# Patient Record
Sex: Male | Born: 1990 | Race: Black or African American | Hispanic: No | Marital: Single | Smoking: Former smoker
Health system: Southern US, Community
[De-identification: ages and names within clinical notes are randomized; demographics above are authoritative.]

## PROBLEM LIST (undated history)

## (undated) DIAGNOSIS — Z789 Other specified health status: Secondary | ICD-10-CM

---
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
## 2011-02-09 LAB — DIFFERENTIAL
Basophils Absolute: 0 10*3/uL (ref 0.0–0.1)
Eosinophils Relative: 0 % (ref 0–5)
Lymphocytes Relative: 17 % (ref 12–46)
Monocytes Absolute: 0.6 10*3/uL (ref 0.1–1.0)
Monocytes Relative: 7 % (ref 3–12)
Neutrophils Relative %: 75 % (ref 43–77)

## 2011-02-09 LAB — CBC
MCHC: 33.8 g/dL (ref 30.0–36.0)
Platelets: 155 10*3/uL (ref 150–400)
RDW: 12.8 % (ref 11.5–15.5)

## 2011-02-09 LAB — BASIC METABOLIC PANEL
BUN: 9 mg/dL (ref 6–23)
CO2: 24 mEq/L (ref 19–32)
Sodium: 141 mEq/L (ref 135–145)

## 2018-02-08 ENCOUNTER — Inpatient Hospital Stay (HOSPITAL_COMMUNITY)
Admission: EM | Admit: 2018-02-08 | Discharge: 2018-02-17 | DRG: 907 | Disposition: A | Discharge status: Home Health | Payer: Self-pay | Attending: General Surgery | Admitting: General Surgery

## 2018-02-08 ENCOUNTER — Encounter (HOSPITAL_COMMUNITY): Admission: EM | Disposition: A | Discharge status: Home Health | Payer: Self-pay | Source: Home / Self Care

## 2018-02-08 ENCOUNTER — Emergency Department (HOSPITAL_COMMUNITY): Payer: Self-pay | Admitting: Certified Registered Nurse Anesthetist

## 2018-02-08 ENCOUNTER — Encounter (HOSPITAL_COMMUNITY): Payer: Self-pay | Admitting: Radiology

## 2018-02-08 ENCOUNTER — Emergency Department (HOSPITAL_COMMUNITY): Payer: Self-pay

## 2018-02-08 ENCOUNTER — Inpatient Hospital Stay (HOSPITAL_COMMUNITY): Payer: Self-pay

## 2018-02-08 DIAGNOSIS — S75012A Minor laceration of femoral artery, left leg, initial encounter: Secondary | ICD-10-CM | POA: Diagnosis present

## 2018-02-08 DIAGNOSIS — S75122A Major laceration of femoral vein at hip and thigh level, left leg, initial encounter: Principal | ICD-10-CM | POA: Diagnosis present

## 2018-02-08 DIAGNOSIS — S72351B Displaced comminuted fracture of shaft of right femur, initial encounter for open fracture type I or II: Secondary | ICD-10-CM

## 2018-02-08 DIAGNOSIS — W3400XA Accidental discharge from unspecified firearms or gun, initial encounter: Secondary | ICD-10-CM

## 2018-02-08 DIAGNOSIS — R571 Hypovolemic shock: Secondary | ICD-10-CM | POA: Diagnosis present

## 2018-02-08 DIAGNOSIS — I743 Embolism and thrombosis of arteries of the lower extremities: Secondary | ICD-10-CM | POA: Diagnosis present

## 2018-02-08 DIAGNOSIS — R000 Tachycardia, unspecified: Secondary | ICD-10-CM | POA: Diagnosis present

## 2018-02-08 DIAGNOSIS — D62 Acute posthemorrhagic anemia: Secondary | ICD-10-CM | POA: Diagnosis present

## 2018-02-08 DIAGNOSIS — S31823A Puncture wound without foreign body of left buttock, initial encounter: Secondary | ICD-10-CM | POA: Diagnosis present

## 2018-02-08 DIAGNOSIS — Y92410 Unspecified street and highway as the place of occurrence of the external cause: Secondary | ICD-10-CM

## 2018-02-08 DIAGNOSIS — S72142A Displaced intertrochanteric fracture of left femur, initial encounter for closed fracture: Secondary | ICD-10-CM | POA: Diagnosis present

## 2018-02-08 DIAGNOSIS — Z419 Encounter for procedure for purposes other than remedying health state, unspecified: Secondary | ICD-10-CM

## 2018-02-08 DIAGNOSIS — S72102A Unspecified trochanteric fracture of left femur, initial encounter for closed fracture: Secondary | ICD-10-CM

## 2018-02-08 DIAGNOSIS — S3133XA Puncture wound without foreign body of scrotum and testes, initial encounter: Secondary | ICD-10-CM | POA: Diagnosis present

## 2018-02-08 HISTORY — PX: INSERTION OF TRACTION PIN: SHX6560

## 2018-02-08 HISTORY — DX: Other specified health status: Z78.9

## 2018-02-08 HISTORY — PX: FEMORAL ARTERY EXPLORATION: SHX5160

## 2018-02-08 HISTORY — PX: SCROTAL EXPLORATION: SHX2386

## 2018-02-08 LAB — POCT I-STAT 7, (LYTES, BLD GAS, ICA,H+H)
ACID-BASE EXCESS: 2 mmol/L (ref 0.0–2.0)
Acid-Base Excess: 2 mmol/L (ref 0.0–2.0)
Acid-Base Excess: 1 mmol/L (ref 0.0–2.0)
Acid-Base Excess: 1 mmol/L (ref 0.0–2.0)
BICARBONATE: 27.2 mmol/L (ref 20.0–28.0)
BICARBONATE: 25 mmol/L (ref 20.0–28.0)
Bicarbonate: 27.1 mmol/L (ref 20.0–28.0)
Bicarbonate: 27 mmol/L (ref 20.0–28.0)
Bicarbonate: 25.5 mmol/L (ref 20.0–28.0)
CALCIUM ION: 0.97 mmol/L — AB (ref 1.15–1.40)
CALCIUM ION: 1.07 mmol/L — AB (ref 1.15–1.40)
CALCIUM ION: 1.1 mmol/L — AB (ref 1.15–1.40)
Calcium, Ion: 0.99 mmol/L — ABNORMAL LOW (ref 1.15–1.40)
Calcium, Ion: 0.99 mmol/L — ABNORMAL LOW (ref 1.15–1.40)
HCT: 27 % — ABNORMAL LOW (ref 39.0–52.0)
HCT: 27 % — ABNORMAL LOW (ref 39.0–52.0)
HCT: 27 % — ABNORMAL LOW (ref 39.0–52.0)
HEMATOCRIT: 25 % — AB (ref 39.0–52.0)
HEMATOCRIT: 25 % — AB (ref 39.0–52.0)
Hemoglobin: 9.2 g/dL — ABNORMAL LOW (ref 13.0–17.0)
Hemoglobin: 8.5 g/dL — ABNORMAL LOW (ref 13.0–17.0)
Hemoglobin: 8.5 g/dL — ABNORMAL LOW (ref 13.0–17.0)
Hemoglobin: 9.2 g/dL — ABNORMAL LOW (ref 13.0–17.0)
Hemoglobin: 9.2 g/dL — ABNORMAL LOW (ref 13.0–17.0)
O2 SAT: 100 %
O2 Saturation: 100 %
O2 Saturation: 100 %
O2 Saturation: 100 %
O2 Saturation: 100 %
PH ART: 7.391 (ref 7.350–7.450)
PH ART: 7.406 (ref 7.350–7.450)
PO2 ART: 548 mmHg — AB (ref 83.0–108.0)
PO2 ART: 283 mmHg — AB (ref 83.0–108.0)
POTASSIUM: 4 mmol/L (ref 3.5–5.1)
POTASSIUM: 4 mmol/L (ref 3.5–5.1)
POTASSIUM: 4.3 mmol/L (ref 3.5–5.1)
Patient temperature: 36.9
Potassium: 4.2 mmol/L (ref 3.5–5.1)
Potassium: 4.2 mmol/L (ref 3.5–5.1)
SODIUM: 142 mmol/L (ref 135–145)
SODIUM: 141 mmol/L (ref 135–145)
SODIUM: 141 mmol/L (ref 135–145)
SODIUM: 140 mmol/L (ref 135–145)
Sodium: 143 mmol/L (ref 135–145)
TCO2: 29 mmol/L (ref 22–32)
TCO2: 29 mmol/L (ref 22–32)
TCO2: 28 mmol/L (ref 22–32)
TCO2: 26 mmol/L (ref 22–32)
TCO2: 27 mmol/L (ref 22–32)
pCO2 arterial: 44.3 mmHg (ref 32.0–48.0)
pCO2 arterial: 47.8 mmHg (ref 32.0–48.0)
pCO2 arterial: 43 mmHg (ref 32.0–48.0)
pCO2 arterial: 40 mmHg (ref 32.0–48.0)
pCO2 arterial: 39.1 mmHg (ref 32.0–48.0)
pH, Arterial: 7.363 (ref 7.350–7.450)
pH, Arterial: 7.403 (ref 7.350–7.450)
pH, Arterial: 7.422 (ref 7.350–7.450)
pO2, Arterial: 523 mmHg — ABNORMAL HIGH (ref 83.0–108.0)
pO2, Arterial: 408 mmHg — ABNORMAL HIGH (ref 83.0–108.0)
pO2, Arterial: 303 mmHg — ABNORMAL HIGH (ref 83.0–108.0)

## 2018-02-08 LAB — CBC WITH DIFFERENTIAL/PLATELET
BASOS ABS: 0 10*3/uL (ref 0.0–0.1)
Band Neutrophils: 3 %
Basophils Relative: 0 %
EOS ABS: 0 10*3/uL (ref 0.0–0.7)
Eosinophils Relative: 0 %
HCT: 32 % — ABNORMAL LOW (ref 39.0–52.0)
HEMOGLOBIN: 11.1 g/dL — AB (ref 13.0–17.0)
LYMPHS PCT: 7 %
Lymphs Abs: 1.3 10*3/uL (ref 0.7–4.0)
MCH: 30.2 pg (ref 26.0–34.0)
MCHC: 34.7 g/dL (ref 30.0–36.0)
MCV: 87.2 fL (ref 78.0–100.0)
MONOS PCT: 5 %
Monocytes Absolute: 0.9 10*3/uL (ref 0.1–1.0)
Neutro Abs: 16.2 10*3/uL — ABNORMAL HIGH (ref 1.7–7.7)
Neutrophils Relative %: 85 %
Platelets: 123 10*3/uL — ABNORMAL LOW (ref 150–400)
RBC: 3.67 MIL/uL — AB (ref 4.22–5.81)
RDW: 13.6 % (ref 11.5–15.5)
WBC: 18.4 10*3/uL — AB (ref 4.0–10.5)

## 2018-02-08 LAB — CBC
HCT: 30.4 % — ABNORMAL LOW (ref 39.0–52.0)
HEMOGLOBIN: 10.8 g/dL — AB (ref 13.0–17.0)
MCH: 29.5 pg (ref 26.0–34.0)
MCHC: 35.5 g/dL (ref 30.0–36.0)
MCV: 83.1 fL (ref 78.0–100.0)
PLATELETS: 111 10*3/uL — AB (ref 150–400)
RBC: 3.66 MIL/uL — ABNORMAL LOW (ref 4.22–5.81)
RDW: 14.8 % (ref 11.5–15.5)
WBC: 9.7 10*3/uL (ref 4.0–10.5)

## 2018-02-08 LAB — DIC (DISSEMINATED INTRAVASCULAR COAGULATION) PANEL
FIBRINOGEN: 210 mg/dL (ref 210–475)
INR: 1.29
PLATELETS: 94 10*3/uL — AB (ref 150–400)

## 2018-02-08 LAB — I-STAT CHEM 8, ED
BUN: 9 mg/dL (ref 6–20)
Calcium, Ion: 1.09 mmol/L — ABNORMAL LOW (ref 1.15–1.40)
Chloride: 105 mmol/L (ref 101–111)
Creatinine, Ser: 1.4 mg/dL — ABNORMAL HIGH (ref 0.61–1.24)
GLUCOSE: 189 mg/dL — AB (ref 65–99)
HEMATOCRIT: 40 % (ref 39.0–52.0)
HEMOGLOBIN: 13.6 g/dL (ref 13.0–17.0)
Potassium: 2.9 mmol/L — ABNORMAL LOW (ref 3.5–5.1)
SODIUM: 142 mmol/L (ref 135–145)
TCO2: 17 mmol/L — AB (ref 22–32)

## 2018-02-08 LAB — DIC (DISSEMINATED INTRAVASCULAR COAGULATION)PANEL
Prothrombin Time: 16 seconds — ABNORMAL HIGH (ref 11.4–15.2)
Smear Review: NONE SEEN
aPTT: 33 seconds (ref 24–36)

## 2018-02-08 LAB — I-STAT CG4 LACTIC ACID, ED: Lactic Acid, Venous: 9.76 mmol/L (ref 0.5–1.9)

## 2018-02-08 LAB — APTT
APTT: 32 s (ref 24–36)
aPTT: 29 seconds (ref 24–36)

## 2018-02-08 LAB — PROTIME-INR
INR: 1.32
INR: 1.21
PROTHROMBIN TIME: 16.2 s — AB (ref 11.4–15.2)
PROTHROMBIN TIME: 15.2 s (ref 11.4–15.2)

## 2018-02-08 LAB — ABO/RH: ABO/RH(D): A NEG

## 2018-02-08 LAB — FIBRINOGEN: FIBRINOGEN: 205 mg/dL — AB (ref 210–475)

## 2018-02-08 SURGERY — EXPLORATION, ARTERY, FEMORAL
Anesthesia: General | Site: Scrotum

## 2018-02-08 MED ORDER — MIDAZOLAM HCL 2 MG/2ML IJ SOLN
INTRAMUSCULAR | Status: DC | PRN
  Administered 2018-02-08: 2 mg via INTRAVENOUS

## 2018-02-08 MED ORDER — ORAL CARE MOUTH RINSE
15.0000 mL | OROMUCOSAL | Status: DC
  Administered 2018-02-09 – 2018-02-10 (×13): 15 mL via OROMUCOSAL

## 2018-02-08 MED ORDER — 0.9 % SODIUM CHLORIDE (POUR BTL) OPTIME
TOPICAL | Status: DC | PRN
  Administered 2018-02-08: 3000 mL

## 2018-02-08 MED ORDER — CHLORHEXIDINE GLUCONATE 0.12% ORAL RINSE (MEDLINE KIT)
15.0000 mL | Freq: Two times a day (BID) | OROMUCOSAL | Status: DC
  Administered 2018-02-09 – 2018-02-10 (×4): 15 mL via OROMUCOSAL

## 2018-02-08 MED ORDER — THROMBIN 5000 UNITS EX SOLR
CUTANEOUS | Status: AC
  Filled 2018-02-08: qty 10000

## 2018-02-08 MED ORDER — PROPOFOL 500 MG/50ML IV EMUL
INTRAVENOUS | Status: DC | PRN
  Administered 2018-02-08: 75 ug/kg/min via INTRAVENOUS

## 2018-02-08 MED ORDER — HYDROMORPHONE HCL 1 MG/ML IJ SOLN
INTRAMUSCULAR | Status: AC
Start: 2018-02-08 — End: 2018-02-08
  Filled 2018-02-08: qty 1

## 2018-02-08 MED ORDER — IODIXANOL 320 MG/ML IV SOLN
INTRAVENOUS | Status: DC | PRN
  Administered 2018-02-08: 50 mL via INTRA_ARTERIAL

## 2018-02-08 MED ORDER — ONDANSETRON 4 MG PO TBDP: 4.0000 mg | ORAL_TABLET | Freq: Four times a day (QID) | ORAL | Status: DC | PRN

## 2018-02-08 MED ORDER — PROPOFOL 1000 MG/100ML IV EMUL
5.0000 ug/kg/min | INTRAVENOUS | Status: DC
  Administered 2018-02-08: 80 ug/kg/min via INTRAVENOUS
  Administered 2018-02-09: 30 ug/kg/min via INTRAVENOUS
  Administered 2018-02-09 (×4): 80 ug/kg/min via INTRAVENOUS
  Administered 2018-02-09 – 2018-02-10 (×2): 30 ug/kg/min via INTRAVENOUS
  Filled 2018-02-08 (×8): qty 100

## 2018-02-08 MED ORDER — FENTANYL CITRATE (PF) 250 MCG/5ML IJ SOLN
INTRAMUSCULAR | Status: AC
  Filled 2018-02-08: qty 5

## 2018-02-08 MED ORDER — ONDANSETRON HCL 4 MG/2ML IJ SOLN
4.0000 mg | Freq: Four times a day (QID) | INTRAMUSCULAR | Status: DC | PRN
  Administered 2018-02-11: 4 mg via INTRAVENOUS
  Filled 2018-02-08: qty 2

## 2018-02-08 MED ORDER — HYDROMORPHONE HCL 1 MG/ML IJ SOLN
INTRAMUSCULAR | Status: DC | PRN
  Administered 2018-02-08: 1 mg via INTRAVENOUS

## 2018-02-08 MED ORDER — PHENYLEPHRINE HCL 10 MG/ML IJ SOLN
INTRAMUSCULAR | Status: DC | PRN
  Administered 2018-02-08 (×3): 80 ug via INTRAVENOUS

## 2018-02-08 MED ORDER — CEFAZOLIN SODIUM 1 G IJ SOLR
INTRAMUSCULAR | Status: AC
Start: 2018-02-08 — End: 2018-02-08
  Filled 2018-02-08: qty 20

## 2018-02-08 MED ORDER — HEMOSTATIC AGENTS (NO CHARGE) OPTIME: TOPICAL | Status: DC | PRN

## 2018-02-08 MED ORDER — MIDAZOLAM HCL 2 MG/2ML IJ SOLN
INTRAMUSCULAR | Status: AC
  Filled 2018-02-08: qty 2

## 2018-02-08 MED ORDER — FENTANYL CITRATE (PF) 100 MCG/2ML IJ SOLN
INTRAMUSCULAR | Status: AC | PRN
  Administered 2018-02-08: 50 ug via INTRAVENOUS

## 2018-02-08 MED ORDER — CEFAZOLIN SODIUM-DEXTROSE 2-3 GM-%(50ML) IV SOLR
INTRAVENOUS | Status: DC | PRN
  Administered 2018-02-08 (×2): 2 g via INTRAVENOUS

## 2018-02-08 MED ORDER — ONDANSETRON HCL 4 MG/2ML IJ SOLN
INTRAMUSCULAR | Status: AC
  Administered 2018-02-08: 4 mg
  Filled 2018-02-08: qty 2

## 2018-02-08 MED ORDER — SODIUM CHLORIDE 0.9 % IV SOLN
INTRAVENOUS | Status: DC | PRN
  Administered 2018-02-08: 500 mL

## 2018-02-08 MED ORDER — SODIUM CHLORIDE 0.9 % IV SOLN
INTRAVENOUS | Status: AC | PRN
  Administered 2018-02-08 (×2): 1000 mL via INTRAVENOUS

## 2018-02-08 MED ORDER — PHENYLEPHRINE HCL 10 MG/ML IJ SOLN
INTRAVENOUS | Status: DC | PRN
  Administered 2018-02-08: 50 ug/min via INTRAVENOUS

## 2018-02-08 MED ORDER — FENTANYL CITRATE (PF) 100 MCG/2ML IJ SOLN
50.0000 ug | INTRAMUSCULAR | Status: DC | PRN
  Administered 2018-02-09 (×4): 75 ug via INTRAVENOUS
  Filled 2018-02-08 (×4): qty 2

## 2018-02-08 MED ORDER — LACTATED RINGERS IV SOLN
INTRAVENOUS | Status: DC | PRN
  Administered 2018-02-08 (×3): via INTRAVENOUS

## 2018-02-08 MED ORDER — DEXAMETHASONE SODIUM PHOSPHATE 10 MG/ML IJ SOLN
INTRAMUSCULAR | Status: DC | PRN
  Administered 2018-02-08: 10 mg via INTRAVENOUS

## 2018-02-08 MED ORDER — SUCCINYLCHOLINE CHLORIDE 200 MG/10ML IV SOSY
PREFILLED_SYRINGE | INTRAVENOUS | Status: DC | PRN
  Administered 2018-02-08: 100 mg via INTRAVENOUS

## 2018-02-08 MED ORDER — SODIUM CHLORIDE 0.9 % IV SOLN
INTRAVENOUS | Status: DC | PRN
  Administered 2018-02-08 (×2): via INTRAVENOUS

## 2018-02-08 MED ORDER — FENTANYL CITRATE (PF) 250 MCG/5ML IJ SOLN
INTRAMUSCULAR | Status: DC | PRN
  Administered 2018-02-08 (×2): 50 ug via INTRAVENOUS
  Administered 2018-02-08: 100 ug via INTRAVENOUS
  Administered 2018-02-08 (×3): 50 ug via INTRAVENOUS
  Administered 2018-02-08 (×2): 25 ug via INTRAVENOUS
  Administered 2018-02-08 (×3): 50 ug via INTRAVENOUS
  Administered 2018-02-08: 100 ug via INTRAVENOUS
  Administered 2018-02-08 (×2): 50 ug via INTRAVENOUS

## 2018-02-08 MED ORDER — ROCURONIUM BROMIDE 10 MG/ML (PF) SYRINGE
PREFILLED_SYRINGE | INTRAVENOUS | Status: DC | PRN
  Administered 2018-02-08: 40 mg via INTRAVENOUS
  Administered 2018-02-08: 20 mg via INTRAVENOUS
  Administered 2018-02-08: 40 mg via INTRAVENOUS
  Administered 2018-02-08: 30 mg via INTRAVENOUS
  Administered 2018-02-08: 40 mg via INTRAVENOUS
  Administered 2018-02-08: 30 mg via INTRAVENOUS

## 2018-02-08 MED ORDER — LIDOCAINE 2% (20 MG/ML) 5 ML SYRINGE
INTRAMUSCULAR | Status: DC | PRN
  Administered 2018-02-08: 60 mg via INTRAVENOUS

## 2018-02-08 MED ORDER — DOCUSATE SODIUM 100 MG PO CAPS
200.0000 mg | ORAL_CAPSULE | Freq: Two times a day (BID) | ORAL | Status: DC
  Administered 2018-02-10: 200 mg via ORAL
  Filled 2018-02-08 (×2): qty 2

## 2018-02-08 MED ORDER — CALCIUM CHLORIDE 10 % IV SOLN
INTRAVENOUS | Status: DC | PRN
  Administered 2018-02-08 (×8): 100 mg via INTRAVENOUS

## 2018-02-08 MED ORDER — LACTATED RINGERS IV SOLN
INTRAVENOUS | Status: DC
  Administered 2018-02-08 – 2018-02-12 (×8): via INTRAVENOUS

## 2018-02-08 MED ORDER — HEMOSTATIC AGENTS (NO CHARGE) OPTIME
TOPICAL | Status: DC | PRN
  Administered 2018-02-08: 1 via TOPICAL

## 2018-02-08 MED ORDER — METOPROLOL TARTRATE 5 MG/5ML IV SOLN
5.0000 mg | Freq: Four times a day (QID) | INTRAVENOUS | Status: DC | PRN
  Administered 2018-02-13 – 2018-02-15 (×2): 5 mg via INTRAVENOUS
  Filled 2018-02-08 (×2): qty 5

## 2018-02-08 MED ORDER — FENTANYL CITRATE (PF) 100 MCG/2ML IJ SOLN
INTRAMUSCULAR | Status: AC
  Filled 2018-02-08: qty 4

## 2018-02-08 MED ORDER — PROPOFOL 10 MG/ML IV BOLUS
INTRAVENOUS | Status: DC | PRN
  Administered 2018-02-08: 200 mg via INTRAVENOUS

## 2018-02-08 MED ORDER — IOPAMIDOL (ISOVUE-370) INJECTION 76%
INTRAVENOUS | Status: AC
  Administered 2018-02-08: 100 mL
  Filled 2018-02-08: qty 100

## 2018-02-08 SURGICAL SUPPLY — 73 items
BAG ISOLATION DRAPE 18X18 (DRAPES) ×2 IMPLANT
BANDAGE ACE 4X5 VEL STRL LF (GAUZE/BANDAGES/DRESSINGS) IMPLANT
BANDAGE ESMARK 6X9 LF (GAUZE/BANDAGES/DRESSINGS) IMPLANT
BNDG ESMARK 6X9 LF (GAUZE/BANDAGES/DRESSINGS)
CANISTER SUCT 3000ML PPV (MISCELLANEOUS) ×4 IMPLANT
CANISTER WOUND CARE 500ML ATS (WOUND CARE) ×4 IMPLANT
CATH EMB 3FR 40CM (CATHETERS) ×8 IMPLANT
CATH EMB 4FR 80CM (CATHETERS) ×8 IMPLANT
CLIP VESOCCLUDE MED 24/CT (CLIP) ×4 IMPLANT
CLIP VESOCCLUDE SM WIDE 24/CT (CLIP) ×4 IMPLANT
CONNECTOR Y ATS VAC SYSTEM (MISCELLANEOUS) ×4 IMPLANT
CUFF TOURNIQUET SINGLE 24IN (TOURNIQUET CUFF) IMPLANT
CUFF TOURNIQUET SINGLE 34IN LL (TOURNIQUET CUFF) IMPLANT
CUFF TOURNIQUET SINGLE 44IN (TOURNIQUET CUFF) IMPLANT
DERMABOND ADVANCED (GAUZE/BANDAGES/DRESSINGS) ×2
DERMABOND ADVANCED .7 DNX12 (GAUZE/BANDAGES/DRESSINGS) ×2 IMPLANT
DRAIN CHANNEL 15F RND FF W/TCR (WOUND CARE) IMPLANT
DRAIN CHANNEL 19F RND (DRAIN) ×4 IMPLANT
DRAPE INCISE IOBAN 66X45 STRL (DRAPES) ×4 IMPLANT
DRAPE ISOLATION BAG 18X18 (DRAPES) ×2
DRAPE X-RAY CASS 24X20 (DRAPES) ×4 IMPLANT
DRSG COVADERM 4X14 (GAUZE/BANDAGES/DRESSINGS) ×4 IMPLANT
DRSG VAC ATS LRG SENSATRAC (GAUZE/BANDAGES/DRESSINGS) ×4 IMPLANT
ELECT REM PT RETURN 9FT ADLT (ELECTROSURGICAL) ×4
ELECTRODE REM PT RTRN 9FT ADLT (ELECTROSURGICAL) ×2 IMPLANT
EVACUATOR SILICONE 100CC (DRAIN) ×4 IMPLANT
GLOVE BIOGEL PI IND STRL 6.5 (GLOVE) ×8 IMPLANT
GLOVE BIOGEL PI IND STRL 7.0 (GLOVE) ×8 IMPLANT
GLOVE BIOGEL PI IND STRL 7.5 (GLOVE) ×2 IMPLANT
GLOVE BIOGEL PI INDICATOR 6.5 (GLOVE) ×8
GLOVE BIOGEL PI INDICATOR 7.0 (GLOVE) ×8
GLOVE BIOGEL PI INDICATOR 7.5 (GLOVE) ×2
GLOVE SURG SS PI 6.5 STRL IVOR (GLOVE) ×12 IMPLANT
GLOVE SURG SS PI 7.0 STRL IVOR (GLOVE) ×4 IMPLANT
GLOVE SURG SS PI 7.5 STRL IVOR (GLOVE) ×4 IMPLANT
GOWN STRL REUS W/ TWL LRG LVL3 (GOWN DISPOSABLE) ×4 IMPLANT
GOWN STRL REUS W/ TWL XL LVL3 (GOWN DISPOSABLE) ×4 IMPLANT
GOWN STRL REUS W/TWL LRG LVL3 (GOWN DISPOSABLE) ×4
GOWN STRL REUS W/TWL XL LVL3 (GOWN DISPOSABLE) ×4
HEMOSTAT SNOW SURGICEL 2X4 (HEMOSTASIS) ×8 IMPLANT
KIT BASIN OR (CUSTOM PROCEDURE TRAY) ×4 IMPLANT
KIT ROOM TURNOVER OR (KITS) ×4 IMPLANT
MARKER GRAFT CORONARY BYPASS (MISCELLANEOUS) IMPLANT
NS IRRIG 1000ML POUR BTL (IV SOLUTION) ×8 IMPLANT
PACK PERIPHERAL VASCULAR (CUSTOM PROCEDURE TRAY) ×4 IMPLANT
PACK UNIVERSAL I (CUSTOM PROCEDURE TRAY) ×4 IMPLANT
PAD ARMBOARD 7.5X6 YLW CONV (MISCELLANEOUS) ×8 IMPLANT
SET COLLECT BLD 21X3/4 12 (NEEDLE) ×4 IMPLANT
SPOGE SURGIFLO 8M (HEMOSTASIS) ×2
SPONGE LAP 18X18 5 PK (GAUZE/BANDAGES/DRESSINGS) ×20 IMPLANT
SPONGE SURGIFLO 8M (HEMOSTASIS) ×2 IMPLANT
STOPCOCK 4 WAY LG BORE MALE ST (IV SETS) ×4 IMPLANT
SUT ETHILON 3 0 PS 1 (SUTURE) ×4 IMPLANT
SUT PROLENE 5 0 C 1 24 (SUTURE) ×24 IMPLANT
SUT PROLENE 6 0 BV (SUTURE) ×44 IMPLANT
SUT PROLENE 6 0 CC (SUTURE) ×4 IMPLANT
SUT PROLENE 7 0 BV 1 (SUTURE) ×20 IMPLANT
SUT SILK 2 0 (SUTURE) ×2
SUT SILK 2-0 18XBRD TIE 12 (SUTURE) ×2 IMPLANT
SUT SILK 3 0 (SUTURE) ×2
SUT SILK 3-0 18XBRD TIE 12 (SUTURE) ×2 IMPLANT
SUT VIC AB 2-0 CT1 27 (SUTURE) ×6
SUT VIC AB 2-0 CT1 TAPERPNT 27 (SUTURE) ×6 IMPLANT
SUT VIC AB 3-0 SH 27 (SUTURE) ×4
SUT VIC AB 3-0 SH 27X BRD (SUTURE) ×4 IMPLANT
SUT VICRYL 4-0 PS2 18IN ABS (SUTURE) ×4 IMPLANT
SYR 30ML LL (SYRINGE) ×4 IMPLANT
SYRINGE 3CC LL L/F (MISCELLANEOUS) ×8 IMPLANT
TOWEL GREEN STERILE (TOWEL DISPOSABLE) ×4 IMPLANT
TRAY FOLEY MTR SLVR 16FR STAT (CATHETERS) ×4 IMPLANT
TUBING EXTENTION W/L.L. (IV SETS) ×4 IMPLANT
UNDERPAD 30X30 (UNDERPADS AND DIAPERS) ×4 IMPLANT
WATER STERILE IRR 1000ML POUR (IV SOLUTION) ×4 IMPLANT
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
## 2018-02-08 NOTE — ED Notes (Addendum)
RBCs started via rapid infuser. Unit # [id number]
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
## 2018-02-08 NOTE — ED Notes (Signed)
Patient accompanied by 2 RNs to CT, to go to OR after. Vascular surgeon and trauma surgeon in [place].

## 2018-02-08 NOTE — ED Notes (Signed)
Plasma started via rapid infuser. Unit # [id number]
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
## 2018-02-08 NOTE — ED Notes (Signed)
Plasma started via rapid infuser. Unit # [id number]
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
## 2018-02-08 NOTE — ED Notes (Signed)
Plasma started via rapid infuser. Unit # [id number]

## 2018-02-08 NOTE — ED Notes (Signed)
Plasma started via rapid infuser. Unit # [id number]

## 2018-02-08 NOTE — Anesthesia Procedure Notes (Signed)
Arterial Line Insertion Start/End3/16/2019 3:10 PM, 02/08/2018 3:12 PM Performed by: CRNA  Patient location: Pre-op. Preanesthetic checklist: patient identified, IV checked, site marked, risks and benefits discussed, surgical consent, monitors and equipment checked, pre-op evaluation, timeout performed and anesthesia consent Lidocaine 1% used for infiltration Left, radial was placed Catheter size: 20 Fr Hand hygiene performed  and maximum sterile barriers used   Attempts: 1 Procedure performed without using ultrasound guided technique. Following insertion, dressing applied. Post procedure assessment: normal and unchanged
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
## 2018-02-08 NOTE — ED Notes (Signed)
Vascular at bedside
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
## 2018-02-08 NOTE — Transfer of Care (Signed)
Immediate Anesthesia Transfer of Care Note  Patient: Townsend XXXsoutherland  Procedure(s) Performed: LEFT SUPERFICIAL FEMORAL ARTERY BYPASS USING SAPHENOUS VEIN GRAFT; LIGATION OF COMMON FEMORAL VEIN, THROMBECTOMY, ANGIOGRAPHY, AND FASCIOTOMIES (Left ) SCROTUM EXPLORATION (N/A Scrotum) INSERTION OF TRACTION PIN (Left )  Patient Location: ICU  Anesthesia Type:General  Level of Consciousness: sedated, unresponsive and Patient remains intubated per anesthesia plan  Airway & Oxygen Therapy: Patient remains intubated per anesthesia plan and Patient placed on Ventilator (see vital sign flow sheet for setting)  Post-op Assessment: Report given to RN and Post -op Vital signs reviewed and stable  Post vital signs: Reviewed and stable  Last Vitals:  Vitals:   02/08/18 1425 02/08/18 2225  BP: (!) 119/95   Pulse: 65   Resp: 14 18  Temp:    SpO2: 100%     Last Pain:  Vitals:   02/08/18 1425  TempSrc:   PainSc: 10-Worst pain ever         Complications: No apparent anesthesia complications
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
## 2018-02-08 NOTE — ED Notes (Signed)
MTP initiated. PRBCs started via rapid infuser. Unit # [id number]

## 2018-02-08 NOTE — ED Notes (Signed)
PRBCs started via rapid infuser. Unit # [id number].
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
## 2018-02-08 NOTE — ED Notes (Signed)
PRBCs started via rapid infuser. Unit # [id number]

## 2018-02-08 NOTE — ED Notes (Signed)
PRBCs started via rapid infuser. Unit # [id number]
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
## 2018-02-08 NOTE — ED Notes (Signed)
PRBCs started via rapid infuser. Unit # [id number] L
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
## 2018-02-08 NOTE — ED Notes (Signed)
Platelets started via rapid infuser. Unit # [id number] W

## 2018-02-08 NOTE — ED Notes (Signed)
PRBCs started via rapid infuser. Unit # [id number] F

## 2018-02-08 NOTE — Anesthesia Procedure Notes (Signed)
Procedure Name: Intubation Date/Time: 02/08/2018 3:03 PM Performed by: Clearnce Sorrel, CRNA Pre-anesthesia Checklist: Patient identified, Emergency Drugs available, Suction available, Patient being monitored and Timeout performed Patient Re-evaluated:Patient Re-evaluated prior to induction Oxygen Delivery Method: Circle system utilized Preoxygenation: Pre-oxygenation with 100% oxygen Induction Type: IV induction and Rapid sequence Laryngoscope Size: Mac and 4 Grade View: Grade II Tube type: Subglottic suction tube Tube size: 7.5 mm Number of attempts: 1 Airway Equipment and Method: Stylet Placement Confirmation: ETT inserted through vocal cords under direct vision,  positive ETCO2 and breath sounds checked- equal and bilateral Secured at: 21 cm Tube secured with: Tape Dental Injury: Teeth and Oropharynx as per pre-operative assessment
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
## 2018-02-08 NOTE — ED Notes (Signed)
PRBCs started via rapid infuser. Unit # [id number] R

## 2018-02-08 NOTE — ED Notes (Signed)
Attempted to waste 150mcg Fentanyl in Pyxis, but d/t medication being taken out under the patient's "doe" name, unable to waste in system. This RN wasted 150mcg in sharps with witness, Dennie MaizesJessica Easley, RN in CT 1. EDP stated to get out 200mcg, but d/t drop in patient's BP, dose was decreased after it was pulled up.

## 2018-02-09 LAB — CBC
HCT: 30 % — ABNORMAL LOW (ref 39.0–52.0)
Hemoglobin: 10.3 g/dL — ABNORMAL LOW (ref 13.0–17.0)
MCH: 28.1 pg (ref 26.0–34.0)
MCHC: 34.3 g/dL (ref 30.0–36.0)
MCV: 81.7 fL (ref 78.0–100.0)
Platelets: 109 10*3/uL — ABNORMAL LOW (ref 150–400)
RBC: 3.67 MIL/uL — ABNORMAL LOW (ref 4.22–5.81)
RDW: 14.6 % (ref 11.5–15.5)
WBC: 11.7 10*3/uL — ABNORMAL HIGH (ref 4.0–10.5)

## 2018-02-09 LAB — POCT I-STAT 3, ART BLOOD GAS (G3+)
Acid-Base Excess: 1 mmol/L (ref 0.0–2.0)
BICARBONATE: 26.2 mmol/L (ref 20.0–28.0)
O2 SAT: 100 %
PCO2 ART: 42.6 mmHg (ref 32.0–48.0)
TCO2: 28 mmol/L (ref 22–32)
pH, Arterial: 7.398 (ref 7.350–7.450)
pO2, Arterial: 201 mmHg — ABNORMAL HIGH (ref 83.0–108.0)

## 2018-02-09 LAB — BPAM PLATELET PHERESIS
Blood Product Expiration Date: 201903162359
Blood Product Expiration Date: 201903162359
ISSUE DATE / TIME: 201903161419
ISSUE DATE / TIME: 201903161524
UNIT TYPE AND RH: 5100
Unit Type and Rh: 6200

## 2018-02-09 LAB — PREPARE PLATELET PHERESIS
UNIT DIVISION: 0
Unit division: 0

## 2018-02-09 LAB — BPAM CRYOPRECIPITATE
Blood Product Expiration Date: 201903162029
Blood Product Expiration Date: 201903162135
ISSUE DATE / TIME: 201903161457
ISSUE DATE / TIME: 201903161605
UNIT TYPE AND RH: 6200
Unit Type and Rh: 6200

## 2018-02-09 LAB — COMPREHENSIVE METABOLIC PANEL
ALBUMIN: 3 g/dL — AB (ref 3.5–5.0)
ALK PHOS: 49 U/L (ref 38–126)
ALT: 22 U/L (ref 17–63)
AST: 65 U/L — AB (ref 15–41)
Anion gap: 10 (ref 5–15)
BILIRUBIN TOTAL: 2.6 mg/dL — AB (ref 0.3–1.2)
BUN: 6 mg/dL (ref 6–20)
CALCIUM: 8.1 mg/dL — AB (ref 8.9–10.3)
CO2: 22 mmol/L (ref 22–32)
CREATININE: 1.02 mg/dL (ref 0.61–1.24)
Chloride: 107 mmol/L (ref 101–111)
GFR calc Af Amer: >60 mL/min (ref >60)
GFR calc non Af Amer: >60 mL/min (ref >60)
GLUCOSE: 150 mg/dL — AB (ref 65–99)
Potassium: 3.8 mmol/L (ref 3.5–5.1)
Sodium: 139 mmol/L (ref 135–145)
Total Protein: 5.2 g/dL — ABNORMAL LOW (ref 6.5–8.1)

## 2018-02-09 LAB — PREPARE CRYOPRECIPITATE
Unit division: 0
Unit division: 0

## 2018-02-09 LAB — BASIC METABOLIC PANEL
Anion gap: 8 (ref 5–15)
BUN: 5 mg/dL — ABNORMAL LOW (ref 6–20)
CALCIUM: 8.4 mg/dL — AB (ref 8.9–10.3)
CO2: 24 mmol/L (ref 22–32)
CREATININE: 0.92 mg/dL (ref 0.61–1.24)
Chloride: 109 mmol/L (ref 101–111)
GFR calc Af Amer: >60 mL/min (ref >60)
GFR calc non Af Amer: >60 mL/min (ref >60)
Glucose, Bld: 146 mg/dL — ABNORMAL HIGH (ref 65–99)
Potassium: 4 mmol/L (ref 3.5–5.1)
SODIUM: 141 mmol/L (ref 135–145)

## 2018-02-09 LAB — MRSA PCR SCREENING: MRSA BY PCR: NEGATIVE

## 2018-02-09 LAB — LACTIC ACID, PLASMA: LACTIC ACID, VENOUS: 3 mmol/L — AB (ref 0.5–1.9)

## 2018-02-09 LAB — TRIGLYCERIDES: Triglycerides: 106 mg/dL (ref <150)

## 2018-02-09 LAB — GLUCOSE, CAPILLARY: Glucose-Capillary: 93 mg/dL (ref 65–99)

## 2018-02-09 LAB — HIV ANTIBODY (ROUTINE TESTING W REFLEX): HIV Screen 4th Generation wRfx: NONREACTIVE

## 2018-02-09 MED ORDER — FENTANYL 2500MCG IN NS 250ML (10MCG/ML) PREMIX INFUSION
0.0000 ug/h | INTRAVENOUS | Status: DC
  Administered 2018-02-09: 175 ug/h via INTRAVENOUS
  Administered 2018-02-09 – 2018-02-10 (×3): 50 ug/h via INTRAVENOUS
  Filled 2018-02-09 (×4): qty 250

## 2018-02-09 MED ORDER — LACTATED RINGERS IV BOLUS (SEPSIS)
1000.0000 mL | Freq: Once | INTRAVENOUS | Status: AC
  Administered 2018-02-09: 1000 mL via INTRAVENOUS

## 2018-02-09 NOTE — Progress Notes (Signed)
PT Cancellation Note  Patient Details Name: Isaiah Mendoza MRN: [id number] DOB: 08/26/1991   Cancelled Treatment:    Reason Eval/Treat Not Completed: Patient not medically ready. Pt remains intubated, sedated, and on strict bed rest. PT eval to be completed as able once medically stable and activity orders updated.  Lewis ShockAshly Wyman Meschke, PT, DPT Pager #: [phone] Office #: [phone]    Isaiah Searingshly M Cambria Osten 02/09/2018, 9:25 AM

## 2018-02-09 NOTE — Progress Notes (Signed)
Follow up - Trauma and Critical Care  Patient Details:    Isaiah Mendoza is an 27 y.o. male.  Lines/tubes : Airway 7.5 mm (Active)  Secured at (cm) 22 cm 02/09/2018  7:59 AM  Measured From Lips 02/09/2018  7:59 AM  Secured Location Right 02/09/2018  7:59 AM  Secured By [employer] 02/09/2018  7:59 AM  Tube Holder Repositioned Yes 02/09/2018  7:59 AM  Cuff Pressure (cm H2O) 30 cm H2O 02/09/2018  7:59 AM  Site Condition Dry 02/09/2018  7:59 AM     Arterial Line 02/08/18 Radial (Active)  Site Assessment Clean;Dry;Intact 02/08/2018 11:00 PM  Line Status Pulsatile blood flow 02/08/2018 11:00 PM  Art Line Waveform Appropriate 02/08/2018 11:00 PM  Art Line Interventions Zeroed and calibrated;Leveled;Flushed per protocol 02/08/2018 11:00 PM  Color/Movement/Sensation Capillary refill less than 3 sec 02/08/2018 11:00 PM  Dressing Type Transparent;Occlusive 02/08/2018 11:00 PM  Dressing Status Clean;Dry;Intact;Antimicrobial disc in place 02/08/2018 11:00 PM  Dressing Change Due 02/15/18 02/08/2018 11:00 PM     Closed System Drain 1 Left Groin Bulb (JP) 19 Fr. (Active)  Site Description Unable to view 02/09/2018 12:00 AM  Dressing Status Clean;Dry;New drainage 02/09/2018 12:00 AM  Drainage Appearance Bloody 02/09/2018 12:00 AM  Status To gravity (Uncharged) 02/09/2018 12:00 AM  Output (mL) 75 mL 02/09/2018  6:00 AM     Negative Pressure Wound Therapy Leg Left;Lateral;Medial (Active)  Cycle Continuous 02/09/2018 12:00 AM  Target Pressure (mmHg) 125 02/09/2018 12:00 AM  Dressing Status Intact 02/09/2018 12:00 AM  Drainage Amount Minimal 02/09/2018 12:00 AM  Output (mL) 100 mL 02/09/2018  6:00 AM     Urethral Catheter Silvestre Gunner RN Latex;Straight-tip 16 Fr. (Active)  Indication for Insertion or Continuance of Catheter Bladder outlet obstruction / other urologic reason 02/09/2018  8:00 AM  Site Assessment Clean;Intact;Dry 02/09/2018 12:00 AM  Catheter Maintenance Bag below level of  bladder;Catheter secured;Drainage bag/tubing not touching floor;Insertion date on drainage bag;No dependent loops;Seal intact;Bag emptied prior to transport 02/09/2018  8:00 AM  Collection Container Standard drainage bag 02/09/2018 12:00 AM  Securement Method Leg strap 02/09/2018 12:00 AM  Urinary Catheter Interventions Unclamped 02/09/2018 12:00 AM  Output (mL) 817 mL 02/09/2018  6:00 AM    Microbiology/Sepsis markers: Results for orders placed or performed during the hospital encounter of 02/08/18  MRSA PCR Screening     Status: None   Collection Time: 02/08/18 11:00 PM  Result Value Ref Range Status   MRSA by PCR NEGATIVE NEGATIVE Final    Comment:        The GeneXpert MRSA Assay (FDA approved for NASAL specimens only), is one component of a comprehensive MRSA colonization surveillance program. It is not intended to diagnose MRSA infection nor to guide or monitor treatment for MRSA infections. Performed at [facility], [address] [place], [place] [address]     Anti-infectives:  Anti-infectives (From admission, onward)   None      Best Practice/Protocols:  Intermittent sedation Mechanical vte prophylaxis and ligated left femoral vein   Consults: Treatment Team:  Md, Trauma, MD Eldred Manges, MD Nada Libman, MD Heloise Purpura, MD    Events:  Chief Complaint/Subjective:    Overnight Issues: Received 9 prbc, 6ffp, 1 plt, 1 cryo yesterday.  Objective:  Vital signs for last 24 hours: Temp:  [97.8 F (36.6 C)-98.6 F (37 C)] 98.2 F (36.8 C) (03/17 0400) Pulse Rate:  [65-153] 120 (03/17 0759) Resp:  [11-27] 18 (03/17 0759) BP: (78-147)/(55-96) 108/65 (  03/17 0759) SpO2:  [99 %-100 %] 100 % (03/17 0759) Arterial Line BP: (125-186)/(55-93) 125/55 (03/17 0700) FiO2 (%):  [30 %-50 %] 30 % (03/17 0759) Weight:  [90.7 kg (200 lb)] 90.7 kg (200 lb) (03/16 1357)  Hemodynamic parameters for last 24 hours:    Intake/Output from previous day: 03/16  0701 - 03/17 0700 In: 12764.7 [I.V.:7426.7; ZOXWR:6045; IV Piggyback:1000] Out: 9436 [Urine:6311; Drains:1525; Blood:1600]  Intake/Output this shift: No intake/output data recorded.  Vent settings for last 24 hours: Vent Mode: PSV;CPAP FiO2 (%):  [30 %-50 %] 30 % Set Rate:  [18 bmp] 18 bmp Vt Set:  [570 mL] 570 mL PEEP:  [5 cmH20] 5 cmH20 Pressure Support:  [10 cmH20] 10 cmH20 Plateau Pressure:  [17 cmH20-19 cmH20] 19 cmH20  Physical Exam:  Gen: intubated sedated HEENT: OG and ETT in place, no scleral icterus Resp: assisted, clear Cardiovascular: tachycardic Abdomen: soft, ND Ext: left leg in traction, fasciotomy with vac in place, left drain with sanguinous drainage, 1+ left DP, 2+ R DP Neuro: GCS 6t  Results for orders placed or performed during the hospital encounter of 02/08/18 (from the past 24 hour(s))  Prepare fresh frozen plasma     Status: None (Preliminary result)   Collection Time: 02/08/18  1:26 PM  Result Value Ref Range   Unit Number [id number]    Blood Component Type LIQ PLASMA    Unit division 00    Status of Unit ISSUED    Unit tag comment VERBAL ORDERS PER DR JAMES    Transfusion Status OK TO TRANSFUSE    Unit Number [id number]    Blood Component Type LIQ PLASMA    Unit division 00    Status of Unit ISSUED    Unit tag comment VERBAL ORDERS PER DR JAMES    Transfusion Status OK TO TRANSFUSE    Unit Number [id number]    Blood Component Type LIQ PLASMA    Unit division 00    Status of Unit ISSUED    Transfusion Status OK TO TRANSFUSE    Unit Number [id number]    Blood Component Type LIQ PLASMA    Unit division 00    Status of Unit ISSUED    Transfusion Status OK TO TRANSFUSE    Unit Number [id number]    Blood Component Type THAWED PLASMA    Unit division 00    Status of Unit REL FROM [facility]    Unit tag comment VERBAL ORDERS PER DR JAMES    Transfusion Status OK TO TRANSFUSE    Unit Number [id number]    Blood Component Type LIQ  PLASMA    Unit division 00    Status of Unit ISSUED    Unit tag comment VERBAL ORDERS PER DR JAMES    Transfusion Status OK TO TRANSFUSE    Unit Number [id number]    Blood Component Type LIQ PLASMA    Unit division 00    Status of Unit ISSUED    Unit tag comment VERBAL ORDERS PER DR JAMES    Transfusion Status OK TO TRANSFUSE    Unit Number [id number]    Blood Component Type LIQ PLASMA    Unit division 00    Status of Unit ISSUED    Unit tag comment VERBAL ORDERS PER DR JAMES    Transfusion Status OK TO TRANSFUSE    Unit Number [id number]    Blood Component Type THAWED PLASMA    Unit division 00    Status of Unit ISSUED  Transfusion Status OK TO TRANSFUSE    Unit Number [id number]    Blood Component Type THAWED PLASMA    Unit division 00    Status of Unit ISSUED    Transfusion Status OK TO TRANSFUSE    Unit Number [id number]    Blood Component Type THAWED PLASMA    Unit division 00    Status of Unit ISSUED    Transfusion Status OK TO TRANSFUSE    Unit Number [id number]    Blood Component Type THAWED PLASMA    Unit division 00    Status of Unit ISSUED    Transfusion Status OK TO TRANSFUSE    Unit Number [id number]    Blood Component Type THAWED PLASMA    Unit division 00    Status of Unit ISSUED    Transfusion Status OK TO TRANSFUSE    Unit Number [id number]    Blood Component Type THAWED PLASMA    Unit division 00    Status of Unit ALLOCATED    Transfusion Status OK TO TRANSFUSE    Unit Number [id number]    Blood Component Type THAWED PLASMA    Unit division 00    Status of Unit ALLOCATED    Transfusion Status      OK TO TRANSFUSE Performed at [facility], [address], [place], [place] [address]    Unit Number [id number]    Blood Component Type THAWED PLASMA    Unit division 00    Status of Unit ISSUED    Transfusion Status OK TO TRANSFUSE    Unit Number [id number]    Blood Component Type THAWED PLASMA    Unit  division 00    Status of Unit ALLOCATED    Transfusion Status OK TO TRANSFUSE    Unit Number [id number]    Blood Component Type THAWED PLASMA    Unit division 00    Status of Unit ALLOCATED    Transfusion Status OK TO TRANSFUSE    Unit Number [id number]    Blood Component Type THAWED PLASMA    Unit division 00    Status of Unit ALLOCATED    Transfusion Status OK TO TRANSFUSE    Unit Number [id number]    Blood Component Type THAWED PLASMA    Unit division 00    Status of Unit ALLOCATED    Transfusion Status OK TO TRANSFUSE   Type and screen Ordered by PROVIDER DEFAULT     Status: None (Preliminary result)   Collection Time: 02/08/18  1:51 PM  Result Value Ref Range   ABO/RH(D) A NEG    Antibody Screen NEG    Sample Expiration 02/11/2018    Unit Number [id number]    Blood Component Type RED CELLS,LR    Unit division 00    Status of Unit ISSUED    Unit tag comment VERBAL ORDERS PER DR JAMES    Transfusion Status OK TO TRANSFUSE    Crossmatch Result COMPATIBLE    Unit Number [id number]    Blood Component Type RED CELLS,LR    Unit division 00    Status of Unit ISSUED    Unit tag comment VERBAL ORDERS PER DR JAMES    Transfusion Status OK TO TRANSFUSE    Crossmatch Result COMPATIBLE    Unit Number [id number]    Blood Component Type RED CELLS,LR    Unit division 00    Status of Unit ISSUED    Unit tag comment VERBAL ORDERS PER DR Fayrene Fearing  Transfusion Status OK TO TRANSFUSE    Crossmatch Result COMPATIBLE    Unit Number [id number]    Blood Component Type RED CELLS,LR    Unit division 00    Status of Unit ISSUED    Unit tag comment VERBAL ORDERS PER DR JAMES    Transfusion Status OK TO TRANSFUSE    Crossmatch Result COMPATIBLE    Unit Number [id number]    Blood Component Type RED CELLS,LR    Unit division 00    Status of Unit ISSUED    Unit tag comment VERBAL ORDERS PER DR JAMES    Transfusion Status OK TO TRANSFUSE    Crossmatch Result  COMPATIBLE    Unit Number [id number]    Blood Component Type RED CELLS,LR    Unit division 00    Status of Unit ISSUED    Unit tag comment VERBAL ORDERS PER DR JAMES    Transfusion Status OK TO TRANSFUSE    Crossmatch Result COMPATIBLE    Unit Number [id number]    Blood Component Type RBC LR PHER1    Unit division 00    Status of Unit ISSUED    Unit tag comment VERBAL ORDERS PER DR JAMES    Transfusion Status OK TO TRANSFUSE    Crossmatch Result COMPATIBLE    Unit Number [id number]    Blood Component Type RED CELLS,LR    Unit division 00    Status of Unit ISSUED    Unit tag comment VERBAL ORDERS PER DR JAMES    Transfusion Status OK TO TRANSFUSE    Crossmatch Result COMPATIBLE    Unit Number [id number]    Blood Component Type RBC LR PHER1    Unit division 00    Status of Unit ISSUED    Unit tag comment VERBAL ORDERS PER DR JAMES    Transfusion Status OK TO TRANSFUSE    Crossmatch Result COMPATIBLE    Unit Number [id number]    Blood Component Type RED CELLS,LR    Unit division 00    Status of Unit ISSUED    Unit tag comment VERBAL ORDERS PER DR JAMES    Transfusion Status OK TO TRANSFUSE    Crossmatch Result COMPATIBLE    Unit Number [id number]    Blood Component Type RED CELLS,LR    Unit division 00    Status of Unit ISSUED    Unit tag comment VERBAL ORDERS PER DR JAMES    Transfusion Status OK TO TRANSFUSE    Crossmatch Result COMPATIBLE    Unit Number [id number]    Blood Component Type RED CELLS,LR    Unit division 00    Status of Unit ISSUED    Unit tag comment VERBAL ORDERS PER DR JAMES    Transfusion Status OK TO TRANSFUSE    Crossmatch Result COMPATIBLE    Unit Number [id number]    Blood Component Type RED CELLS,LR    Unit division 00    Status of Unit ISSUED    Transfusion Status OK TO TRANSFUSE    Crossmatch Result Compatible    Unit Number [id number]    Blood Component Type RED CELLS,LR    Unit division 00    Status of  Unit ISSUED    Transfusion Status OK TO TRANSFUSE    Crossmatch Result Compatible    Unit Number [id number]    Blood Component Type RED CELLS,LR    Unit division 00    Status of Unit ALLOCATED    Transfusion Status OK TO TRANSFUSE  Crossmatch Result Compatible    Unit Number [id number]    Blood Component Type RED CELLS,LR    Unit division 00    Status of Unit ALLOCATED    Transfusion Status OK TO TRANSFUSE    Crossmatch Result      Compatible Performed at [facility], [address], [place], [place] [address]    Unit Number [id number]    Blood Component Type RED CELLS,LR    Unit division 00    Status of Unit ALLOCATED    Transfusion Status OK TO TRANSFUSE    Crossmatch Result Compatible    Unit Number [id number]    Blood Component Type RCLI PHER 1    Unit division 00    Status of Unit ALLOCATED    Transfusion Status OK TO TRANSFUSE    Crossmatch Result Compatible    Unit Number [id number]    Blood Component Type RCLI PHER 2    Unit division 00    Status of Unit ALLOCATED    Transfusion Status OK TO TRANSFUSE    Crossmatch Result Compatible    Unit Number [id number]    Blood Component Type RED CELLS,LR    Unit division 00    Status of Unit ALLOCATED    Transfusion Status OK TO TRANSFUSE    Crossmatch Result Compatible   ABO/Rh     Status: None   Collection Time: 02/08/18  1:51 PM  Result Value Ref Range   ABO/RH(D)      A NEG Performed at [facility], [address], [place], [place] [address]   I-Stat CG4 Lactic Acid, ED     Status: Abnormal   Collection Time: 02/08/18  1:59 PM  Result Value Ref Range   Lactic Acid, Venous 9.76 (HH) 0.5 - 1.9 mmol/L   Comment NOTIFIED PHYSICIAN   I-stat chem 8, ed     Status: Abnormal   Collection Time: 02/08/18  1:59 PM  Result Value Ref Range   Sodium 142 135 - 145 mmol/L   Potassium 2.9 (L) 3.5 - 5.1 mmol/L   Chloride 105 101 - 111 mmol/L   BUN 9 6 - 20 mg/dL   Creatinine, Ser 4.33 (H)  0.61 - 1.24 mg/dL   Glucose, Bld 295 (H) 65 - 99 mg/dL   Calcium, Ion 1.88 (L) 1.15 - 1.40 mmol/L   TCO2 17 (L) 22 - 32 mmol/L   Hemoglobin 13.6 13.0 - 17.0 g/dL   HCT 41.6 60.6 - 30.1 %  Prepare platelet pheresis     Status: None (Preliminary result)   Collection Time: 02/08/18  2:18 PM  Result Value Ref Range   Unit Number [id number]    Blood Component Type PLTPHER LI2    Unit division 00    Status of Unit ISSUED    Unit tag comment VERBAL ORDERS PER DR JAMES    Transfusion Status OK TO TRANSFUSE    Unit Number [id number]    Blood Component Type PLTPHER LR2    Unit division 00    Status of Unit ISSUED    Transfusion Status OK TO TRANSFUSE   Prepare cryoprecipitate     Status: None (Preliminary result)   Collection Time: 02/08/18  2:50 PM  Result Value Ref Range   Unit Number [id number]    Blood Component Type CRYPOOL THAW    Unit division 00    Status of Unit ISSUED    Transfusion Status OK TO TRANSFUSE    Unit Number [id number]  Blood Component Type CRYPOOL THAW    Unit division 00    Status of Unit EXPIRED/DESTROYED    Transfusion Status      OK TO TRANSFUSE Performed at [facility], [address] [place], [place] [address]   CBC with Differential/Platelet     Status: Abnormal   Collection Time: 02/08/18  3:55 PM  Result Value Ref Range   WBC 18.4 (H) 4.0 - 10.5 K/uL   RBC 3.67 (L) 4.22 - 5.81 MIL/uL   Hemoglobin 11.1 (L) 13.0 - 17.0 g/dL   HCT 60.4 (L) 54.0 - 98.1 %   MCV 87.2 78.0 - 100.0 fL   MCH 30.2 26.0 - 34.0 pg   MCHC 34.7 30.0 - 36.0 g/dL   RDW 19.1 47.8 - 29.5 %   Platelets 123 (L) 150 - 400 K/uL   Neutrophils Relative % 85 %   Lymphocytes Relative 7 %   Monocytes Relative 5 %   Eosinophils Relative 0 %   Basophils Relative 0 %   Band Neutrophils 3 %   Neutro Abs 16.2 (H) 1.7 - 7.7 K/uL   Lymphs Abs 1.3 0.7 - 4.0 K/uL   Monocytes Absolute 0.9 0.1 - 1.0 K/uL   Eosinophils Absolute 0.0 0.0 - 0.7 K/uL   Basophils Absolute 0.0  0.0 - 0.1 K/uL   Smear Review MORPHOLOGY UNREMARKABLE   Fibrinogen (coagulopathy lab panel)     Status: Abnormal   Collection Time: 02/08/18  3:55 PM  Result Value Ref Range   Fibrinogen 205 (L) 210 - 475 mg/dL  Protime-INR (coagulopathy lab panel)     Status: Abnormal   Collection Time: 02/08/18  3:55 PM  Result Value Ref Range   Prothrombin Time 16.2 (H) 11.4 - 15.2 seconds   INR 1.32   APTT (coagulopathy lab panel)     Status: None   Collection Time: 02/08/18  3:55 PM  Result Value Ref Range   aPTT 32 24 - 36 seconds  I-STAT 7, (LYTES, BLD GAS, ICA, H+H)     Status: Abnormal   Collection Time: 02/08/18  4:20 PM  Result Value Ref Range   pH, Arterial 7.391 7.350 - 7.450   pCO2 arterial 44.3 32.0 - 48.0 mmHg   pO2, Arterial 548.0 (H) 83.0 - 108.0 mmHg   Bicarbonate 27.2 20.0 - 28.0 mmol/L   TCO2 29 22 - 32 mmol/L   O2 Saturation 100.0 %   Acid-Base Excess 2.0 0.0 - 2.0 mmol/L   Sodium 143 135 - 145 mmol/L   Potassium 4.2 3.5 - 5.1 mmol/L   Calcium, Ion 0.99 (L) 1.15 - 1.40 mmol/L   HCT 27.0 (L) 39.0 - 52.0 %   Hemoglobin 9.2 (L) 13.0 - 17.0 g/dL   Patient temperature 62.1 C    Sample type ARTERIAL   I-STAT 7, (LYTES, BLD GAS, ICA, H+H)     Status: Abnormal   Collection Time: 02/08/18  5:25 PM  Result Value Ref Range   pH, Arterial 7.363 7.350 - 7.450   pCO2 arterial 47.8 32.0 - 48.0 mmHg   pO2, Arterial 523.0 (H) 83.0 - 108.0 mmHg   Bicarbonate 27.1 20.0 - 28.0 mmol/L   TCO2 29 22 - 32 mmol/L   O2 Saturation 100.0 %   Acid-Base Excess 1.0 0.0 - 2.0 mmol/L   Sodium 142 135 - 145 mmol/L   Potassium 4.0 3.5 - 5.1 mmol/L   Calcium, Ion 0.97 (L) 1.15 - 1.40 mmol/L   HCT 25.0 (L) 39.0 -  52.0 %   Hemoglobin 8.5 (L) 13.0 - 17.0 g/dL   Patient temperature 96.037.2 C    Sample type ARTERIAL   I-STAT 7, (LYTES, BLD GAS, ICA, H+H)     Status: Abnormal   Collection Time: 02/08/18  6:55 PM  Result Value Ref Range   pH, Arterial 7.406 7.350 - 7.450   pCO2 arterial 43.0 32.0 - 48.0  mmHg   pO2, Arterial 408.0 (H) 83.0 - 108.0 mmHg   Bicarbonate 27.0 20.0 - 28.0 mmol/L   TCO2 28 22 - 32 mmol/L   O2 Saturation 100.0 %   Acid-Base Excess 2.0 0.0 - 2.0 mmol/L   Sodium 141 135 - 145 mmol/L   Potassium 4.0 3.5 - 5.1 mmol/L   Calcium, Ion 1.07 (L) 1.15 - 1.40 mmol/L   HCT 25.0 (L) 39.0 - 52.0 %   Hemoglobin 8.5 (L) 13.0 - 17.0 g/dL   Patient temperature HIDE    Sample type ARTERIAL   DIC (disseminated intravasc coag) panel     Status: Abnormal   Collection Time: 02/08/18  7:53 PM  Result Value Ref Range   Prothrombin Time 16.0 (H) 11.4 - 15.2 seconds   INR 1.29    aPTT 33 24 - 36 seconds   Fibrinogen 210 210 - 475 mg/dL   D-Dimer, Quant >45.40>20.00 (H) 0.00 - 0.50 ug/mL-FEU   Platelets 94 (L) 150 - 400 K/uL   Smear Review NO SCHISTOCYTES SEEN   I-STAT 7, (LYTES, BLD GAS, ICA, H+H)     Status: Abnormal   Collection Time: 02/08/18  8:27 PM  Result Value Ref Range   pH, Arterial 7.403 7.350 - 7.450   pCO2 arterial 40.0 32.0 - 48.0 mmHg   pO2, Arterial 283.0 (H) 83.0 - 108.0 mmHg   Bicarbonate 25.0 20.0 - 28.0 mmol/L   TCO2 26 22 - 32 mmol/L   O2 Saturation 100.0 %   Sodium 141 135 - 145 mmol/L   Potassium 4.2 3.5 - 5.1 mmol/L   Calcium, Ion 0.99 (L) 1.15 - 1.40 mmol/L   HCT 27.0 (L) 39.0 - 52.0 %   Hemoglobin 9.2 (L) 13.0 - 17.0 g/dL   Patient temperature 98.136.9 C    Sample type ARTERIAL   I-STAT 7, (LYTES, BLD GAS, ICA, H+H)     Status: Abnormal   Collection Time: 02/08/18  9:47 PM  Result Value Ref Range   pH, Arterial 7.422 7.350 - 7.450   pCO2 arterial 39.1 32.0 - 48.0 mmHg   pO2, Arterial 303.0 (H) 83.0 - 108.0 mmHg   Bicarbonate 25.5 20.0 - 28.0 mmol/L   TCO2 27 22 - 32 mmol/L   O2 Saturation 100.0 %   Acid-Base Excess 1.0 0.0 - 2.0 mmol/L   Sodium 140 135 - 145 mmol/L   Potassium 4.3 3.5 - 5.1 mmol/L   Calcium, Ion 1.10 (L) 1.15 - 1.40 mmol/L   HCT 27.0 (L) 39.0 - 52.0 %   Hemoglobin 9.2 (L) 13.0 - 17.0 g/dL   Patient temperature HIDE    Sample type  ARTERIAL   MRSA PCR Screening     Status: None   Collection Time: 02/08/18 11:00 PM  Result Value Ref Range   MRSA by PCR NEGATIVE NEGATIVE  Lactic acid, plasma     Status: Abnormal   Collection Time: 02/08/18 11:19 PM  Result Value Ref Range   Lactic Acid, Venous 3.0 (HH) 0.5 - 1.9 mmol/L  CBC     Status: Abnormal   Collection Time: 02/08/18 11:19  PM  Result Value Ref Range   WBC 9.7 4.0 - 10.5 K/uL   RBC 3.66 (L) 4.22 - 5.81 MIL/uL   Hemoglobin 10.8 (L) 13.0 - 17.0 g/dL   HCT 16.1 (L) 09.6 - 04.5 %   MCV 83.1 78.0 - 100.0 fL   MCH 29.5 26.0 - 34.0 pg   MCHC 35.5 30.0 - 36.0 g/dL   RDW 40.9 81.1 - 91.4 %   Platelets 111 (L) 150 - 400 K/uL  Comprehensive metabolic panel     Status: Abnormal   Collection Time: 02/08/18 11:19 PM  Result Value Ref Range   Sodium 139 135 - 145 mmol/L   Potassium 3.8 3.5 - 5.1 mmol/L   Chloride 107 101 - 111 mmol/L   CO2 22 22 - 32 mmol/L   Glucose, Bld 150 (H) 65 - 99 mg/dL   BUN 6 6 - 20 mg/dL   Creatinine, Ser 7.82 0.61 - 1.24 mg/dL   Calcium 8.1 (L) 8.9 - 10.3 mg/dL   Total Protein 5.2 (L) 6.5 - 8.1 g/dL   Albumin 3.0 (L) 3.5 - 5.0 g/dL   AST 65 (H) 15 - 41 U/L   ALT 22 17 - 63 U/L   Alkaline Phosphatase 49 38 - 126 U/L   Total Bilirubin 2.6 (H) 0.3 - 1.2 mg/dL   GFR calc non Af Amer >60 >60 mL/min   GFR calc Af Amer >60 >60 mL/min   Anion gap 10 5 - 15  Protime-INR     Status: None   Collection Time: 02/08/18 11:19 PM  Result Value Ref Range   Prothrombin Time 15.2 11.4 - 15.2 seconds   INR 1.21   APTT     Status: None   Collection Time: 02/08/18 11:19 PM  Result Value Ref Range   aPTT 29 24 - 36 seconds  Triglycerides     Status: None   Collection Time: 02/08/18 11:39 PM  Result Value Ref Range   Triglycerides 106 <150 mg/dL  I-STAT 3, arterial blood gas (G3+)     Status: Abnormal   Collection Time: 02/09/18  3:48 AM  Result Value Ref Range   pH, Arterial 7.398 7.350 - 7.450   pCO2 arterial 42.6 32.0 - 48.0 mmHg   pO2,  Arterial 201.0 (H) 83.0 - 108.0 mmHg   Bicarbonate 26.2 20.0 - 28.0 mmol/L   TCO2 28 22 - 32 mmol/L   O2 Saturation 100.0 %   Acid-Base Excess 1.0 0.0 - 2.0 mmol/L   Patient temperature 98.6 F    Collection site ARTERIAL LINE    Drawn by Operator    Sample type ARTERIAL   CBC     Status: Abnormal   Collection Time: 02/09/18  4:16 AM  Result Value Ref Range   WBC 11.7 (H) 4.0 - 10.5 K/uL   RBC 3.67 (L) 4.22 - 5.81 MIL/uL   Hemoglobin 10.3 (L) 13.0 - 17.0 g/dL   HCT 95.6 (L) 21.3 - 08.6 %   MCV 81.7 78.0 - 100.0 fL   MCH 28.1 26.0 - 34.0 pg   MCHC 34.3 30.0 - 36.0 g/dL   RDW 57.8 46.9 - 62.9 %   Platelets 109 (L) 150 - 400 K/uL  Basic metabolic panel     Status: Abnormal   Collection Time: 02/09/18  4:16 AM  Result Value Ref Range   Sodium 141 135 - 145 mmol/L   Potassium 4.0 3.5 - 5.1 mmol/L   Chloride 109 101 - 111 mmol/L  CO2 24 22 - 32 mmol/L   Glucose, Bld 146 (H) 65 - 99 mg/dL   BUN 5 (L) 6 - 20 mg/dL   Creatinine, Ser 1.61 0.61 - 1.24 mg/dL   Calcium 8.4 (L) 8.9 - 10.3 mg/dL   GFR calc non Af Amer >60 >60 mL/min   GFR calc Af Amer >60 >60 mL/min   Anion gap 8 5 - 15     Assessment/Plan:   NEURO  L4 nerve root injury, left femoral nerve contusion   Plan: remain sedated today, frequent neuro checks, discussed case with NSG, may be up to 63mo for recovery of nerve function or may not recover  PULM  Intubated for hemorrhagic shock   Plan: continue support today, wean to PS  CARDIO  Tachycardic, massive resuscitation 3/16   Plan: cotninue monitoring, good UOP and pressure as well as lactate decrease  RENAL  Cr and UOP good, scrotal injury   Plan: repaired by Laverle Patter, continue maintenance fluids  GI  No abdominal visceral injury   Plan: continue NPO at this time, may start TF soon vs extubate  ID  Prophylaxis for surgery complete   Plan: continue high value nursing ICU hygiene  HEME  Hgb 10, stable over 6h, drain with sanguinous output   Plan: continue to  monitor  ENDO No issues   Plan: SSI  Global Issues   ICU for major injury and resuscitation, follow up ortho and vascular for further plans on further interventional timing and advancement   LOS: 1 day   Additional comments: I reviewed labs, Hgb 10.3 which is stable over 6h, Cr improved from 1.02 to 0.96  Critical Care Total Time*: 15 Minutes  De Blanch Kinsinger 02/09/2018  *Care during the described time interval was provided by me and/or other providers on the critical care team.  I have reviewed this patient's available data, including medical history, events of note, physical examination and test results as part of my evaluation.
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
## 2018-02-09 NOTE — Progress Notes (Signed)
   Subjective: 1 Day Post-Op Procedure(s) (LRB): LEFT SUPERFICIAL FEMORAL ARTERY BYPASS USING SAPHENOUS VEIN GRAFT; LIGATION OF COMMON FEMORAL VEIN, THROMBECTOMY, ANGIOGRAPHY, AND FASCIOTOMIES (Left) SCROTUM EXPLORATION (N/A) INSERTION OF TRACTION PIN (Left) Patient reports pain as none. Intubated on sedation protocol.   Objective: Vital signs in last 24 hours: Temp:  [98.2 F (36.8 C)-99.7 F (37.6 C)] 99.2 F (37.3 C) (03/17 1552) Pulse Rate:  [96-140] 140 (03/17 1700) Resp:  [0-20] 18 (03/17 1700) BP: (101-174)/(60-96) 101/63 (03/17 1700) SpO2:  [100 %] 100 % (03/17 1700) Arterial Line BP: (116-186)/(46-93) 142/56 (03/17 1700) FiO2 (%):  [30 %-50 %] 30 % (03/17 1600)  Intake/Output from previous day: 03/16 0701 - 03/17 0700 In: 12764.7 [I.V.:7426.7; UUVOZ:3664Blood:4338; IV Piggyback:1000] Out: 9436 [Urine:6311; Drains:1525; Blood:1600] Intake/Output this shift: Total I/O In: 1913.8 [I.V.:1913.8] Out: 1815 [Urine:1475; Drains:340]  Recent Labs    02/08/18 1855 02/08/18 2027 02/08/18 2147 02/08/18 2319 02/09/18 0416  HGB 8.5* 9.2* 9.2* 10.8* 10.3*   Recent Labs    02/08/18 2319 02/09/18 0416  WBC 9.7 11.7*  RBC 3.66* 3.67*  HCT 30.4* 30.0*  PLT 111* 109*   Recent Labs    02/08/18 2319 02/09/18 0416  NA 139 141  K 3.8 4.0  CL 107 109  CO2 22 24  BUN 6 5*  CREATININE 1.02 0.92  GLUCOSE 150* 146*  CALCIUM 8.1* 8.4*   Recent Labs    02/08/18 1953 02/08/18 2319  INR 1.29 1.21    PE:  Traction pin intact.  No results found.  Assessment/Plan: 1 Day Post-Op Procedure(s) (LRB): LEFT SUPERFICIAL FEMORAL ARTERY BYPASS USING SAPHENOUS VEIN GRAFT; LIGATION OF COMMON FEMORAL VEIN, THROMBECTOMY, ANGIOGRAPHY, AND FASCIOTOMIES (Left) SCROTUM EXPLORATION (N/A) INSERTION OF TRACTION PIN (Left) Plan:   Pt with elevated lactic acid which has improved , transfused several units and Hgb stabilizing. Can do Femur fixation Wednesday afternoon if he continues to improve and  stabilize.  My cell [phone] Eldred MangesMark C Yates 02/09/2018, 6:11 PM

## 2018-02-10 ENCOUNTER — Encounter (HOSPITAL_COMMUNITY): Payer: Self-pay | Admitting: Surgery

## 2018-02-10 LAB — POCT I-STAT 7, (LYTES, BLD GAS, ICA,H+H)
ACID-BASE DEFICIT: 1 mmol/L (ref 0.0–2.0)
Bicarbonate: 26.3 mmol/L (ref 20.0–28.0)
Calcium, Ion: 1.04 mmol/L — ABNORMAL LOW (ref 1.15–1.40)
HEMATOCRIT: 37 % — AB (ref 39.0–52.0)
HEMOGLOBIN: 12.6 g/dL — AB (ref 13.0–17.0)
O2 SAT: 100 %
PH ART: 7.319 — AB (ref 7.350–7.450)
PO2 ART: 512 mmHg — AB (ref 83.0–108.0)
POTASSIUM: 4 mmol/L (ref 3.5–5.1)
Sodium: 142 mmol/L (ref 135–145)
TCO2: 28 mmol/L (ref 22–32)
pCO2 arterial: 50.1 mmHg — ABNORMAL HIGH (ref 32.0–48.0)

## 2018-02-10 LAB — LACTIC ACID, PLASMA: LACTIC ACID, VENOUS: 1.1 mmol/L (ref 0.5–1.9)

## 2018-02-10 NOTE — Progress Notes (Signed)
    Subjective  - POD #2  intubated   Physical Exam:  Able to wiggle toes on left and indicates sensation is intact. Doppler left DP/PT   JP:  1055     Assessment/Plan:  POD #2  Will need vacs changed.  May be able to partially close fasciotomies.  Will try to coordinate with ortho  Neurologic function improved to left leg  Consider ASA   Isaiah Mendoza 02/10/2018 9:14 AM --  Vitals:   02/10/18 0800 02/10/18 0835  BP:  129/61  Pulse:  (!) 133  Resp:  12  Temp: (!) 100.4 F (38 C)   SpO2:  98%    Intake/Output Summary (Last 24 hours) at 02/10/2018 0914 Last data filed at 02/10/2018 0800 Gross per 24 hour  Intake 3401.62 ml  Output 3525 ml  Net -123.38 ml     Laboratory CBC    Component Value Date/Time   WBC 11.7 (H) 02/09/2018 0416   HGB 10.3 (L) 02/09/2018 0416   HCT 30.0 (L) 02/09/2018 0416   PLT 109 (L) 02/09/2018 0416    BMET    Component Value Date/Time   NA 141 02/09/2018 0416   K 4.0 02/09/2018 0416   CL 109 02/09/2018 0416   CO2 24 02/09/2018 0416   GLUCOSE 146 (H) 02/09/2018 0416   BUN 5 (L) 02/09/2018 0416   CREATININE 0.92 02/09/2018 0416   CALCIUM 8.4 (L) 02/09/2018 0416   GFRNONAA >60 02/09/2018 0416   GFRAA >60 02/09/2018 0416    COAG Lab Results  Component Value Date   INR 1.21 02/08/2018   INR 1.29 02/08/2018   INR 1.32 02/08/2018   No results found for: PTT  Antibiotics Anti-infectives (From admission, onward)   None       V. Charlena CrossWells [name] IV, M.D. [facility] [place] Office: [phone] Pager:  [phone]

## 2018-02-10 NOTE — Progress Notes (Signed)
OT Cancellation Note  Patient Details Name: Isaiah Mendoza XXXsoutherland MRN: [id number] DOB: May 01, 1991   Cancelled Treatment:    Reason Eval/Treat Not Completed: Medical issues which prohibited therapy;Patient not medically ready. Pt remains intubated and await fixation of femur. Will sign off and await new order post-operatively.  Doristine Sectionharity A Makyia Erxleben, MS OTR/L  Pager: [phone]   Doristine SectionCharity A Keiyon Plack 02/10/2018, 7:19 AM

## 2018-02-10 NOTE — Progress Notes (Signed)
PT Cancellation Note  Patient Details Name: Isaiah Mendoza MRN: [id number] DOB: Apr 18, 1991   Cancelled Treatment:    Reason Eval/Treat Not Completed: Patient not medically ready(remains intubated and await fixation of femur. Will sign off and await new order post op)   Isaiah Mendoza 02/10/2018, 7:16 AM  Isaiah Mendoza, PT [phone]

## 2018-02-10 NOTE — Procedures (Signed)
Extubation Procedure Note  Patient Details:   Name: Isaiah Mendoza DOB: 11/05/1991 MRN: [id number]   Airway Documentation:     Evaluation  O2 sats: stable throughout Complications: No apparent complications Patient did tolerate procedure well. Bilateral Breath Sounds: Clear   Yes   Patient was extubated to a 4L [place] without any complications, dyspnea or stridor noted. Patient was instructed on IS x 10, highest goal achieved was 1750mL.  Isaiah Mendoza, Isaiah Mendoza 02/10/2018, 8:35 AM

## 2018-02-10 NOTE — Progress Notes (Signed)
Follow up - Trauma and Critical Care  Patient Details:    Isaiah Mendoza is an 27 y.o. male.  Lines/tubes : Airway 7.5 mm (Active)  Secured at (cm) 23 cm 02/10/2018  3:20 AM  Measured From Lips 02/10/2018  3:20 AM  Secured Location Left 02/10/2018  3:20 AM  Secured By [employer] Tube Holder 02/10/2018  3:20 AM  Tube Holder Repositioned Yes 02/10/2018  3:20 AM  Cuff Pressure (cm H2O) 22 cm H2O 02/09/2018 11:55 PM  Site Condition Dry 02/09/2018  3:37 PM     Arterial Line 02/08/18 Radial (Active)  Site Assessment Clean;Dry;Intact 02/09/2018  8:00 PM  Line Status Pulsatile blood flow 02/09/2018  8:00 PM  Art Line Waveform Appropriate 02/09/2018  8:00 PM  Art Line Interventions Zeroed and calibrated;Connections checked and tightened;Flushed per protocol 02/09/2018  8:00 PM  Color/Movement/Sensation Capillary refill less than 3 sec 02/09/2018  8:00 PM  Dressing Type Transparent;Occlusive 02/09/2018  8:00 PM  Dressing Status Clean;Dry;Intact;Antimicrobial disc in place 02/09/2018  8:00 PM  Dressing Change Due 02/15/18 02/09/2018  8:00 PM     Closed System Drain 1 Left Groin Bulb (JP) 19 Fr. (Active)  Site Description Unable to view 02/09/2018  8:00 PM  Dressing Status Clean;Dry;Intact 02/09/2018  8:00 PM  Drainage Appearance Bloody 02/09/2018  8:00 PM  Status To gravity (Uncharged) 02/09/2018  8:00 PM  Output (mL) 75 mL 02/10/2018  6:00 AM     Negative Pressure Wound Therapy Leg Left;Lateral;Medial (Active)  Site / Wound Assessment Clean;Dry 02/09/2018  8:00 PM  Cycle Continuous 02/09/2018  8:00 PM  Target Pressure (mmHg) 125 02/09/2018  8:00 PM  Dressing Status Intact 02/09/2018  8:00 PM  Drainage Amount Minimal 02/09/2018  8:00 PM  Drainage Description Serosanguineous 02/09/2018  8:00 PM  Output (mL) 100 mL 02/10/2018  6:00 AM     Urethral Catheter Silvestre GunnerStacey Perkins RN Latex;Straight-tip 16 Fr. (Active)  Indication for Insertion or Continuance of Catheter Bladder outlet obstruction / other urologic  reason 02/09/2018  8:00 PM  Site Assessment Clean;Intact 02/09/2018  8:00 PM  Catheter Maintenance Bag below level of bladder;Catheter secured;Drainage bag/tubing not touching floor;Insertion date on drainage bag;No dependent loops;Seal intact;Bag emptied prior to transport 02/09/2018  8:00 PM  Collection Container Standard drainage bag 02/09/2018  8:00 PM  Securement Method Leg strap 02/09/2018  8:00 PM  Urinary Catheter Interventions Unclamped 02/09/2018  8:00 PM  Output (mL) 250 mL 02/10/2018  6:00 AM    Microbiology/Sepsis markers: Results for orders placed or performed during the hospital encounter of 02/08/18  MRSA PCR Screening     Status: None   Collection Time: 02/08/18 11:00 PM  Result Value Ref Range Status   MRSA by PCR NEGATIVE NEGATIVE Final    Comment:        The GeneXpert MRSA Assay (FDA approved for NASAL specimens only), is one component of a comprehensive MRSA colonization surveillance program. It is not intended to diagnose MRSA infection nor to guide or monitor treatment for MRSA infections. Performed at [facility], [address] [place], [place] [address]     Anti-infectives:  Anti-infectives (From admission, onward)   None      Best Practice/Protocols:  GI Prophylaxis: Proton Pump Inhibitor No VTE prophylaxis Continous Sedation in spite of being sedated, he is wide awake.  Consults: Treatment Team:  Md, Trauma, MD Eldred MangesYates, Mark C, MD Nada LibmanBrabham, Vance W, MD Heloise PurpuraBorden, Lester, MD    Events:  Subjective:    Overnight Issues: Stable overnight and  continues to be stable  Objective:  Vital signs for last 24 hours: Temp:  [98.5 F (36.9 C)-99.7 F (37.6 C)] 98.5 F (36.9 C) (03/18 0400) Pulse Rate:  [110-140] 120 (03/18 0700) Resp:  [0-20] 18 (03/18 0700) BP: (91-174)/(47-78) 117/59 (03/18 0700) SpO2:  [100 %] 100 % (03/18 0700) Arterial Line BP: (116-186)/(39-76) 127/54 (03/18 0700) FiO2 (%):  [30 %] 30 % (03/18 0700)  Hemodynamic  parameters for last 24 hours:    Intake/Output from previous day: 03/17 0701 - 03/18 0700 In: 3798.2 [I.V.:3798.2] Out: 3680 [Urine:2625; Drains:1055]  Intake/Output this shift: No intake/output data recorded.  Vent settings for last 24 hours: Vent Mode: PRVC FiO2 (%):  [30 %] 30 % Set Rate:  [18 bmp] 18 bmp Vt Set:  [570 mL] 570 mL PEEP:  [5 cmH20] 5 cmH20 Pressure Support:  [10 cmH20] 10 cmH20 Plateau Pressure:  [16 cmH20-19 cmH20] 19 cmH20  Physical Exam:  General: alert and no respiratory distress Neuro: alert, oriented and nonfocal exam Resp: clear to auscultation bilaterally CVS: Sinus tachycardia GI: soft, nontender, BS WNL, no r/g Extremities: no edema, no erythema, pulses WNL and Left leg in traction.  Great DP pulse.  Has senstion and mmovement of the left foot.  Results for orders placed or performed during the hospital encounter of 02/08/18 (from the past 24 hour(s))  Glucose, capillary     Status: None   Collection Time: 02/09/18  8:23 PM  Result Value Ref Range   Glucose-Capillary 93 65 - 99 mg/dL     Assessment/Plan:   NEURO  Altered Mental Status:  Awake and alert in spite of being on sedation.   Plan: Will wean sedation and extubate if the plan is not to go to surgery soon  PULM  Clear to auscultation.\   Plan: CPM and try to get extubated soon  CARDIO  Sinus Tachycardia   Plan: Probably pain related.  RENAL  Urine output and renal function are good   Plan: CPM  GI  No issues   Plan: CPM.  No tube feedings for now.  Pending surgery  ID  No known infectious sources   Plan: CPM  HEME  Anemia acute blood loss anemia)   Plan: Hemoglobin stable at 10.3  ENDO No specific issues   Plan: CPM  Global Issues  Patient is wide awake and calm.  No acute distress.  Still needs shattered left femoral neck fixed.  Awaiting to hear the plan from orthopedic surgeon.  The patient is stable for surgery today.    LOS: 2 days   Additional comments:I  reviewed the patient's new clinical lab test results. cbc/bmet  Critical Care Total Time*: 30 Minutes  Jimmye Norman 02/10/2018  *Care during the described time interval was provided by me and/or other providers on the critical care team.  I have reviewed this patient's available data, including medical history, events of note, physical examination and test results as part of my evaluation.

## 2018-02-10 NOTE — Plan of Care (Signed)
Patient was extubated today. Remains in skeletal traction of left leg until surgery tentatively scheduled for Wednesday.  Progressed from clear liquid to regular diet with no complications.  Pain is well controlled with Fentanyl. Isaiah Mendoza

## 2018-02-11 LAB — PREPARE FRESH FROZEN PLASMA
UNIT DIVISION: 0
UNIT DIVISION: 0
UNIT DIVISION: 0
UNIT DIVISION: 0
UNIT DIVISION: 0
Unit division: 0
Unit division: 0
Unit division: 0
Unit division: 0
Unit division: 0
Unit division: 0
Unit division: 0
Unit division: 0
Unit division: 0
Unit division: 0
Unit division: 0
Unit division: 0
Unit division: 0
Unit division: 0
Unit division: 0

## 2018-02-11 LAB — BPAM FFP
BLOOD PRODUCT EXPIRATION DATE: 201903182359
BLOOD PRODUCT EXPIRATION DATE: 201903182359
BLOOD PRODUCT EXPIRATION DATE: 201903212359
BLOOD PRODUCT EXPIRATION DATE: 201903212359
BLOOD PRODUCT EXPIRATION DATE: 201903212359
BLOOD PRODUCT EXPIRATION DATE: 201903212359
BLOOD PRODUCT EXPIRATION DATE: 201903212359
BLOOD PRODUCT EXPIRATION DATE: 201903212359
BLOOD PRODUCT EXPIRATION DATE: 201904062359
Blood Product Expiration Date: 201903212359
Blood Product Expiration Date: 201903212359
Blood Product Expiration Date: 201903212359
Blood Product Expiration Date: 201903212359
Blood Product Expiration Date: 201903212359
Blood Product Expiration Date: 201903212359
Blood Product Expiration Date: 201904062359
Blood Product Expiration Date: 201904062359
Blood Product Expiration Date: 201904062359
Blood Product Expiration Date: 201904062359
Blood Product Expiration Date: 201904072359
ISSUE DATE / TIME: 201903161351
ISSUE DATE / TIME: 201903161359
ISSUE DATE / TIME: 201903161359
ISSUE DATE / TIME: 201903161438
ISSUE DATE / TIME: 201903161438
ISSUE DATE / TIME: 201903161438
ISSUE DATE / TIME: 201903161510
ISSUE DATE / TIME: 201903161510
ISSUE DATE / TIME: 201903161510
ISSUE DATE / TIME: 201903161645
ISSUE DATE / TIME: 201903161645
ISSUE DATE / TIME: 201903161332
ISSUE DATE / TIME: 201903161332
ISSUE DATE / TIME: 201903161351
ISSUE DATE / TIME: 201903161359
ISSUE DATE / TIME: 201903161412
ISSUE DATE / TIME: 201903161438
ISSUE DATE / TIME: 201903161510
ISSUE DATE / TIME: 201903161645
ISSUE DATE / TIME: 201903161645
UNIT TYPE AND RH: 6200
UNIT TYPE AND RH: 6200
UNIT TYPE AND RH: 6200
UNIT TYPE AND RH: 6200
UNIT TYPE AND RH: 6200
UNIT TYPE AND RH: 6200
UNIT TYPE AND RH: 6200
UNIT TYPE AND RH: 6200
UNIT TYPE AND RH: 6200
UNIT TYPE AND RH: 6200
UNIT TYPE AND RH: 6200
UNIT TYPE AND RH: 6200
Unit Type and Rh: 6200
Unit Type and Rh: 6200
Unit Type and Rh: 6200
Unit Type and Rh: 6200
Unit Type and Rh: 6200
Unit Type and Rh: 6200
Unit Type and Rh: 6200
Unit Type and Rh: 6200

## 2018-02-11 LAB — BLOOD PRODUCT ORDER (VERBAL) VERIFICATION

## 2018-02-11 LAB — CBC WITH DIFFERENTIAL/PLATELET
BASOS ABS: 0 10*3/uL (ref 0.0–0.1)
BASOS ABS: 0 10*3/uL (ref 0.0–0.1)
BASOS PCT: 0 %
Basophils Relative: 0 %
EOS ABS: 0.1 10*3/uL (ref 0.0–0.7)
EOS PCT: 0 %
Eosinophils Absolute: 0.1 10*3/uL (ref 0.0–0.7)
Eosinophils Relative: 0 %
HCT: 19.8 % — ABNORMAL LOW (ref 39.0–52.0)
HCT: 19.8 % — ABNORMAL LOW (ref 39.0–52.0)
HEMOGLOBIN: 6.6 g/dL — AB (ref 13.0–17.0)
HEMOGLOBIN: 6.4 g/dL — AB (ref 13.0–17.0)
LYMPHS ABS: 1.6 10*3/uL (ref 0.7–4.0)
LYMPHS PCT: 14 %
Lymphocytes Relative: 13 %
Lymphs Abs: 2 10*3/uL (ref 0.7–4.0)
MCH: 28.4 pg (ref 26.0–34.0)
MCH: 27.5 pg (ref 26.0–34.0)
MCHC: 33.3 g/dL (ref 30.0–36.0)
MCHC: 32.3 g/dL (ref 30.0–36.0)
MCV: 85.3 fL (ref 78.0–100.0)
MCV: 85 fL (ref 78.0–100.0)
MONO ABS: 1.2 10*3/uL — AB (ref 0.1–1.0)
MONOS PCT: 9 %
Monocytes Absolute: 1 10*3/uL (ref 0.1–1.0)
Monocytes Relative: 7 %
NEUTROS ABS: 10.7 10*3/uL — AB (ref 1.7–7.7)
NEUTROS PCT: 77 %
NEUTROS PCT: 80 %
Neutro Abs: 10.3 10*3/uL — ABNORMAL HIGH (ref 1.7–7.7)
PLATELETS: 98 10*3/uL — AB (ref 150–400)
Platelets: 99 10*3/uL — ABNORMAL LOW (ref 150–400)
RBC: 2.32 MIL/uL — ABNORMAL LOW (ref 4.22–5.81)
RBC: 2.33 MIL/uL — AB (ref 4.22–5.81)
RDW: 14.4 % (ref 11.5–15.5)
RDW: 14.2 % (ref 11.5–15.5)
WBC: 14 10*3/uL — ABNORMAL HIGH (ref 4.0–10.5)
WBC: 12.9 10*3/uL — AB (ref 4.0–10.5)

## 2018-02-11 LAB — BASIC METABOLIC PANEL
ANION GAP: 8 (ref 5–15)
BUN: <5 mg/dL — ABNORMAL LOW (ref 6–20)
CALCIUM: 8.2 mg/dL — AB (ref 8.9–10.3)
CHLORIDE: 101 mmol/L (ref 101–111)
CO2: 25 mmol/L (ref 22–32)
Creatinine, Ser: 0.76 mg/dL (ref 0.61–1.24)
GFR calc Af Amer: >60 mL/min (ref >60)
GFR calc non Af Amer: >60 mL/min (ref >60)
GLUCOSE: 123 mg/dL — AB (ref 65–99)
Potassium: 3.4 mmol/L — ABNORMAL LOW (ref 3.5–5.1)
Sodium: 134 mmol/L — ABNORMAL LOW (ref 135–145)

## 2018-02-11 LAB — HEMOGLOBIN AND HEMATOCRIT, BLOOD
HEMATOCRIT: 25.5 % — AB (ref 39.0–52.0)
Hemoglobin: 8.5 g/dL — ABNORMAL LOW (ref 13.0–17.0)

## 2018-02-11 LAB — PREPARE RBC (CROSSMATCH)

## 2018-02-11 MED ORDER — FENTANYL CITRATE (PF) 100 MCG/2ML IJ SOLN
50.0000 ug | INTRAMUSCULAR | Status: DC
  Administered 2018-02-11 (×8): 100 ug via INTRAVENOUS
  Filled 2018-02-11 (×8): qty 2

## 2018-02-11 MED ORDER — SODIUM CHLORIDE 0.9 % IV SOLN
Freq: Once | INTRAVENOUS | Status: AC
  Administered 2018-02-11: 10:00:00 via INTRAVENOUS

## 2018-02-11 MED ORDER — SODIUM CHLORIDE 0.9 % IV SOLN: Freq: Once | INTRAVENOUS | Status: DC

## 2018-02-11 NOTE — Progress Notes (Signed)
100 ml fentanyl wasted with Misty StanleyLisa RN in sink.

## 2018-02-11 NOTE — Progress Notes (Signed)
  Progress Note    02/11/2018 10:21 AM 3 Days Post-Op  Subjective:  No complaints today   Vitals:   02/11/18 0900 02/11/18 1015  BP: 137/79 (!) 143/71  Pulse: (!) 115 99  Resp: (!) 9 (!) 9  Temp:  99.4 F (37.4 C)  SpO2: 100% 100%   Physical Exam: Cardiac:  Tachycardic Lungs:  Non labored Incisions:  R groin incision healing well; L groin incision intact without drainage or hematoma, wound edges approximated well with staples; fasciotomy incisions with wound vacs in place with serosanguinous collection in cannister Extremities:  Palpable L DP Abdomen:  Soft Neurologic: sensation intact L foot; able to wiggle toes L foot  CBC    Component Value Date/Time   WBC 12.9 (H) 02/11/2018 0809   RBC 2.33 (L) 02/11/2018 0809   HGB 6.4 (LL) 02/11/2018 0809   HCT 19.8 (L) 02/11/2018 0809   PLT 98 (L) 02/11/2018 0809   MCV 85.0 02/11/2018 0809   MCH 27.5 02/11/2018 0809   MCHC 32.3 02/11/2018 0809   RDW 14.2 02/11/2018 0809   LYMPHSABS 1.6 02/11/2018 0809   MONOABS 1.0 02/11/2018 0809   EOSABS 0.1 02/11/2018 0809   BASOSABS 0.0 02/11/2018 0809    BMET    Component Value Date/Time   NA 134 (L) 02/11/2018 0503   K 3.4 (L) 02/11/2018 0503   CL 101 02/11/2018 0503   CO2 25 02/11/2018 0503   GLUCOSE 123 (H) 02/11/2018 0503   BUN <5 (L) 02/11/2018 0503   CREATININE 0.76 02/11/2018 0503   CALCIUM 8.2 (L) 02/11/2018 0503   GFRNONAA >60 02/11/2018 0503   GFRAA >60 02/11/2018 0503    INR    Component Value Date/Time   INR 1.21 02/08/2018 2319     Intake/Output Summary (Last 24 hours) at 02/11/2018 1021 Last data filed at 02/11/2018 1015 Gross per 24 hour  Intake 2382.46 ml  Output 2045 ml  Net 337.46 ml     Assessment/Plan:  27 y.o. male is s/p  #1: Repair of left superficial femoral artery injury with a contralateral right greater saphenous vein interposition graft #2: Ligation of left femoral vein #3: 4 compartment fasciotomies #4: Thrombectomy of left  superficial femoral and popliteal artery as well as the tibial arteries #5: Intraoperative arteriogram #6: Exposure of left below-knee popliteal artery #7: Exposure of left anterior tibial artery with thrombectomy #8: Application of wound VAC to the medial and lateral fasciotomy incisions #9: Vein patch angioplasty to the distal common femoral artery and proximal superficial femoral artery following endarterectomy   3 Days Post-Op   L foot sensation intact with palpable L DP Continue dry dressing changes to L groin incision prn and daily Ok to d/c JP drain Plan is to try to close fasciotomy incisions tomorrow in OR Patient agrees to proceed   Emilie RutterMatthew Ramirez Fullbright, PA-C [facility] [phone] 02/11/2018 10:21 AM
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
## 2018-02-11 NOTE — Social Work (Signed)
CSW went to meet with patient at bedside to complete SBIRT screening. Pt declined to complete at this time due to pain and requested CSW come back.  CSW will f/u.  Keene BreathPatricia Angelo Prindle, LCSW Clinical Social Worker [phone]
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
## 2018-02-12 ENCOUNTER — Inpatient Hospital Stay (HOSPITAL_COMMUNITY): Payer: Self-pay | Admitting: Certified Registered Nurse Anesthetist

## 2018-02-12 ENCOUNTER — Encounter (HOSPITAL_COMMUNITY): Admission: EM | Disposition: A | Discharge status: Home Health | Payer: Self-pay | Source: Home / Self Care

## 2018-02-12 ENCOUNTER — Encounter (HOSPITAL_COMMUNITY): Payer: Self-pay | Admitting: Certified Registered Nurse Anesthetist

## 2018-02-12 ENCOUNTER — Inpatient Hospital Stay (HOSPITAL_COMMUNITY): Payer: Self-pay

## 2018-02-12 DIAGNOSIS — S71102A Unspecified open wound, left thigh, initial encounter: Secondary | ICD-10-CM

## 2018-02-12 DIAGNOSIS — S72142A Displaced intertrochanteric fracture of left femur, initial encounter for closed fracture: Secondary | ICD-10-CM

## 2018-02-12 HISTORY — PX: HARDWARE REMOVAL: SHX979

## 2018-02-12 HISTORY — PX: INTRAMEDULLARY (IM) NAIL INTERTROCHANTERIC: SHX5875

## 2018-02-12 LAB — TYPE AND SCREEN
ABO/RH(D): A NEG
Antibody Screen: NEGATIVE
UNIT DIVISION: 0
UNIT DIVISION: 0
UNIT DIVISION: 0
UNIT DIVISION: 0
UNIT DIVISION: 0
UNIT DIVISION: 0
UNIT DIVISION: 0
UNIT DIVISION: 0
UNIT DIVISION: 0
UNIT DIVISION: 0
UNIT DIVISION: 0
Unit division: 0
Unit division: 0
Unit division: 0
Unit division: 0
Unit division: 0
Unit division: 0
Unit division: 0
Unit division: 0
Unit division: 0

## 2018-02-12 LAB — CBC WITH DIFFERENTIAL/PLATELET
BASOS ABS: 0 10*3/uL (ref 0.0–0.1)
BASOS PCT: 0 %
EOS PCT: 1 %
Eosinophils Absolute: 0.1 10*3/uL (ref 0.0–0.7)
HCT: 25.4 % — ABNORMAL LOW (ref 39.0–52.0)
Hemoglobin: 8.5 g/dL — ABNORMAL LOW (ref 13.0–17.0)
Lymphocytes Relative: 15 %
Lymphs Abs: 1.5 10*3/uL (ref 0.7–4.0)
MCH: 28.7 pg (ref 26.0–34.0)
MCHC: 33.5 g/dL (ref 30.0–36.0)
MCV: 85.8 fL (ref 78.0–100.0)
Monocytes Absolute: 1.1 10*3/uL — ABNORMAL HIGH (ref 0.1–1.0)
Monocytes Relative: 10 %
NEUTROS ABS: 7.9 10*3/uL — AB (ref 1.7–7.7)
Neutrophils Relative %: 74 %
PLATELETS: 112 10*3/uL — AB (ref 150–400)
RBC: 2.96 MIL/uL — ABNORMAL LOW (ref 4.22–5.81)
RDW: 14.3 % (ref 11.5–15.5)
WBC: 10.6 10*3/uL — ABNORMAL HIGH (ref 4.0–10.5)

## 2018-02-12 LAB — BPAM RBC
BLOOD PRODUCT EXPIRATION DATE: 201904042359
BLOOD PRODUCT EXPIRATION DATE: 201904082359
BLOOD PRODUCT EXPIRATION DATE: 201904082359
BLOOD PRODUCT EXPIRATION DATE: 201904092359
BLOOD PRODUCT EXPIRATION DATE: 201904092359
BLOOD PRODUCT EXPIRATION DATE: 201904092359
BLOOD PRODUCT EXPIRATION DATE: 201904112359
BLOOD PRODUCT EXPIRATION DATE: 201904122359
BLOOD PRODUCT EXPIRATION DATE: 201904172359
Blood Product Expiration Date: 201904042359
Blood Product Expiration Date: 201904072359
Blood Product Expiration Date: 201904082359
Blood Product Expiration Date: 201904082359
Blood Product Expiration Date: 201904082359
Blood Product Expiration Date: 201904082359
Blood Product Expiration Date: 201904092359
Blood Product Expiration Date: 201904092359
Blood Product Expiration Date: 201904092359
Blood Product Expiration Date: 201904112359
Blood Product Expiration Date: 201904122359
ISSUE DATE / TIME: 201903161332
ISSUE DATE / TIME: 201903161332
ISSUE DATE / TIME: 201903161350
ISSUE DATE / TIME: 201903161350
ISSUE DATE / TIME: 201903161355
ISSUE DATE / TIME: 201903161355
ISSUE DATE / TIME: 201903161432
ISSUE DATE / TIME: 201903161432
ISSUE DATE / TIME: 201903161432
ISSUE DATE / TIME: 201903161432
ISSUE DATE / TIME: 201903161511
ISSUE DATE / TIME: 201903161511
ISSUE DATE / TIME: 201903161511
ISSUE DATE / TIME: 201903161659
ISSUE DATE / TIME: 201903161659
ISSUE DATE / TIME: 201903191005
ISSUE DATE / TIME: 201903161355
ISSUE DATE / TIME: 201903161355
ISSUE DATE / TIME: 201903161659
ISSUE DATE / TIME: 201903191214
UNIT TYPE AND RH: 5100
UNIT TYPE AND RH: 600
UNIT TYPE AND RH: 9500
UNIT TYPE AND RH: 9500
Unit Type and Rh: 5100
Unit Type and Rh: 5100
Unit Type and Rh: 5100
Unit Type and Rh: 600
Unit Type and Rh: 600
Unit Type and Rh: 600
Unit Type and Rh: 600
Unit Type and Rh: 600
Unit Type and Rh: 600
Unit Type and Rh: 600
Unit Type and Rh: 600
Unit Type and Rh: 600
Unit Type and Rh: 600
Unit Type and Rh: 600
Unit Type and Rh: 9500
Unit Type and Rh: 9500

## 2018-02-12 LAB — BASIC METABOLIC PANEL
Anion gap: 8 (ref 5–15)
BUN: <5 mg/dL — ABNORMAL LOW (ref 6–20)
CALCIUM: 8.4 mg/dL — AB (ref 8.9–10.3)
CO2: 27 mmol/L (ref 22–32)
Chloride: 104 mmol/L (ref 101–111)
Creatinine, Ser: 0.72 mg/dL (ref 0.61–1.24)
Glucose, Bld: 111 mg/dL — ABNORMAL HIGH (ref 65–99)
Potassium: 3.4 mmol/L — ABNORMAL LOW (ref 3.5–5.1)
Sodium: 139 mmol/L (ref 135–145)

## 2018-02-12 LAB — SURGICAL PCR SCREEN
MRSA, PCR: NEGATIVE
Staphylococcus aureus: NEGATIVE

## 2018-02-12 LAB — CBC
HCT: 28.6 % — ABNORMAL LOW (ref 39.0–52.0)
Hemoglobin: 9.9 g/dL — ABNORMAL LOW (ref 13.0–17.0)
MCH: 28.7 pg (ref 26.0–34.0)
MCHC: 34.6 g/dL (ref 30.0–36.0)
MCV: 82.9 fL (ref 78.0–100.0)
Platelets: 118 10*3/uL — ABNORMAL LOW (ref 150–400)
RBC: 3.45 MIL/uL — ABNORMAL LOW (ref 4.22–5.81)
RDW: 14.8 % (ref 11.5–15.5)
WBC: 15.5 10*3/uL — ABNORMAL HIGH (ref 4.0–10.5)

## 2018-02-12 LAB — PREPARE RBC (CROSSMATCH)

## 2018-02-12 SURGERY — REMOVAL, HARDWARE
Anesthesia: General | Site: Leg Lower | Laterality: Left

## 2018-02-12 MED ORDER — ROCURONIUM BROMIDE 10 MG/ML (PF) SYRINGE
PREFILLED_SYRINGE | INTRAVENOUS | Status: AC
  Filled 2018-02-12: qty 5

## 2018-02-12 MED ORDER — SUGAMMADEX SODIUM 200 MG/2ML IV SOLN
INTRAVENOUS | Status: DC | PRN
  Administered 2018-02-12: 500 mg via INTRAVENOUS

## 2018-02-12 MED ORDER — MIDAZOLAM HCL 5 MG/5ML IJ SOLN
INTRAMUSCULAR | Status: DC | PRN
  Administered 2018-02-12: 2 mg via INTRAVENOUS

## 2018-02-12 MED ORDER — FENTANYL CITRATE (PF) 100 MCG/2ML IJ SOLN
100.0000 ug | Freq: Once | INTRAMUSCULAR | Status: AC
  Administered 2018-02-12: 100 ug via INTRAVENOUS

## 2018-02-12 MED ORDER — LIDOCAINE HCL (CARDIAC) 20 MG/ML IV SOLN
INTRAVENOUS | Status: DC | PRN
  Administered 2018-02-12: 60 mg via INTRAVENOUS

## 2018-02-12 MED ORDER — CEFAZOLIN SODIUM-DEXTROSE 2-4 GM/100ML-% IV SOLN
INTRAVENOUS | Status: AC
  Filled 2018-02-12: qty 100

## 2018-02-12 MED ORDER — PHENYLEPHRINE HCL 10 MG/ML IJ SOLN
INTRAMUSCULAR | Status: DC | PRN
  Administered 2018-02-12: 80 ug via INTRAVENOUS
  Administered 2018-02-12: 120 ug via INTRAVENOUS

## 2018-02-12 MED ORDER — HYDROMORPHONE HCL 1 MG/ML IJ SOLN
INTRAMUSCULAR | Status: AC
Start: 2018-02-12 — End: 2018-02-12
  Filled 2018-02-12: qty 0.5

## 2018-02-12 MED ORDER — PROMETHAZINE HCL 25 MG/ML IJ SOLN: 6.2500 mg | INTRAMUSCULAR | Status: DC | PRN

## 2018-02-12 MED ORDER — HYDROMORPHONE HCL 1 MG/ML IJ SOLN
INTRAMUSCULAR | Status: AC
  Administered 2018-02-12: 0.5 mg via INTRAVENOUS
  Filled 2018-02-12: qty 1

## 2018-02-12 MED ORDER — HYDROMORPHONE HCL 1 MG/ML IJ SOLN
0.5000 mg | INTRAMUSCULAR | Status: DC | PRN
  Administered 2018-02-12 – 2018-02-13 (×4): 1 mg via INTRAVENOUS
  Filled 2018-02-12 (×3): qty 1

## 2018-02-12 MED ORDER — PHENOL 1.4 % MT LIQD: 1.0000 | OROMUCOSAL | Status: DC | PRN

## 2018-02-12 MED ORDER — ASPIRIN EC 325 MG PO TBEC
325.0000 mg | DELAYED_RELEASE_TABLET | Freq: Every day | ORAL | Status: DC
  Administered 2018-02-13 – 2018-02-17 (×5): 325 mg via ORAL
  Filled 2018-02-12 (×5): qty 1

## 2018-02-12 MED ORDER — METOCLOPRAMIDE HCL 5 MG/ML IJ SOLN: 5.0000 mg | Freq: Three times a day (TID) | INTRAMUSCULAR | Status: DC | PRN

## 2018-02-12 MED ORDER — BUPIVACAINE-EPINEPHRINE (PF) 0.5% -1:200000 IJ SOLN
INTRAMUSCULAR | Status: AC
  Filled 2018-02-12: qty 30

## 2018-02-12 MED ORDER — HYDROMORPHONE HCL 1 MG/ML IJ SOLN
INTRAMUSCULAR | Status: AC
  Administered 2018-02-13: 1 mg
  Filled 2018-02-12: qty 1

## 2018-02-12 MED ORDER — HYDROMORPHONE HCL 1 MG/ML IJ SOLN
0.5000 mg | INTRAMUSCULAR | Status: DC | PRN
  Administered 2018-02-12 (×4): 1 mg via INTRAVENOUS
  Filled 2018-02-12 (×4): qty 1

## 2018-02-12 MED ORDER — HYDROMORPHONE HCL 1 MG/ML IJ SOLN
0.5000 mg | INTRAMUSCULAR | Status: DC | PRN
  Administered 2018-02-12 (×3): 0.5 mg via INTRAVENOUS

## 2018-02-12 MED ORDER — DEXAMETHASONE SODIUM PHOSPHATE 10 MG/ML IJ SOLN
INTRAMUSCULAR | Status: AC
  Filled 2018-02-12: qty 1

## 2018-02-12 MED ORDER — DOCUSATE SODIUM 100 MG PO CAPS
100.0000 mg | ORAL_CAPSULE | Freq: Two times a day (BID) | ORAL | Status: DC
  Administered 2018-02-13 – 2018-02-17 (×9): 100 mg via ORAL
  Filled 2018-02-12 (×9): qty 1

## 2018-02-12 MED ORDER — LACTATED RINGERS IV SOLN
INTRAVENOUS | Status: DC
  Administered 2018-02-12: 12:00:00 via INTRAVENOUS

## 2018-02-12 MED ORDER — PHENYLEPHRINE 40 MCG/ML (10ML) SYRINGE FOR IV PUSH (FOR BLOOD PRESSURE SUPPORT)
PREFILLED_SYRINGE | INTRAVENOUS | Status: AC
  Filled 2018-02-12: qty 10

## 2018-02-12 MED ORDER — MENTHOL 3 MG MT LOZG
1.0000 | LOZENGE | OROMUCOSAL | Status: DC | PRN
  Administered 2018-02-17: 3 mg via ORAL
  Filled 2018-02-12: qty 9

## 2018-02-12 MED ORDER — CEFAZOLIN SODIUM-DEXTROSE 1-4 GM/50ML-% IV SOLN
1.0000 g | Freq: Three times a day (TID) | INTRAVENOUS | Status: AC
  Administered 2018-02-12 – 2018-02-13 (×3): 1 g via INTRAVENOUS
  Filled 2018-02-12 (×3): qty 50

## 2018-02-12 MED ORDER — MIDAZOLAM HCL 2 MG/2ML IJ SOLN
INTRAMUSCULAR | Status: AC
  Filled 2018-02-12: qty 2

## 2018-02-12 MED ORDER — DEXMEDETOMIDINE HCL 200 MCG/2ML IV SOLN
INTRAVENOUS | Status: DC | PRN
  Administered 2018-02-12 (×4): 20 ug via INTRAVENOUS

## 2018-02-12 MED ORDER — SODIUM CHLORIDE 0.9 % IV SOLN: Freq: Once | INTRAVENOUS | Status: DC

## 2018-02-12 MED ORDER — 0.9 % SODIUM CHLORIDE (POUR BTL) OPTIME
TOPICAL | Status: DC | PRN
  Administered 2018-02-12 (×2): 1000 mL

## 2018-02-12 MED ORDER — FENTANYL CITRATE (PF) 100 MCG/2ML IJ SOLN
INTRAMUSCULAR | Status: AC
  Administered 2018-02-12: 100 ug via INTRAVENOUS
  Filled 2018-02-12: qty 2

## 2018-02-12 MED ORDER — HYDROMORPHONE HCL 1 MG/ML IJ SOLN
0.5000 mg | Freq: Once | INTRAMUSCULAR | Status: AC
  Administered 2018-02-12: 0.5 mg via INTRAVENOUS
  Filled 2018-02-12: qty 0.5

## 2018-02-12 MED ORDER — FENTANYL CITRATE (PF) 100 MCG/2ML IJ SOLN
INTRAMUSCULAR | Status: DC | PRN
  Administered 2018-02-12: 150 ug via INTRAVENOUS
  Administered 2018-02-12: 100 ug via INTRAVENOUS
  Administered 2018-02-12: 250 ug via INTRAVENOUS

## 2018-02-12 MED ORDER — HYDROMORPHONE HCL 1 MG/ML IJ SOLN: 0.2500 mg | INTRAMUSCULAR | Status: DC | PRN

## 2018-02-12 MED ORDER — ONDANSETRON HCL 4 MG/2ML IJ SOLN
INTRAMUSCULAR | Status: AC
  Filled 2018-02-12: qty 4

## 2018-02-12 MED ORDER — HYDROMORPHONE HCL 1 MG/ML IJ SOLN
INTRAMUSCULAR | Status: DC | PRN
  Administered 2018-02-12 (×2): 0.5 mg via INTRAVENOUS

## 2018-02-12 MED ORDER — ACETAMINOPHEN 325 MG PO TABS: 325.0000 mg | ORAL_TABLET | Freq: Four times a day (QID) | ORAL | Status: DC | PRN

## 2018-02-12 MED ORDER — ONDANSETRON HCL 4 MG/2ML IJ SOLN
INTRAMUSCULAR | Status: DC | PRN
  Administered 2018-02-12: 4 mg via INTRAVENOUS

## 2018-02-12 MED ORDER — ROCURONIUM BROMIDE 100 MG/10ML IV SOLN
INTRAVENOUS | Status: DC | PRN
  Administered 2018-02-12 (×3): 50 mg via INTRAVENOUS

## 2018-02-12 MED ORDER — PROPOFOL 10 MG/ML IV BOLUS
INTRAVENOUS | Status: AC
  Filled 2018-02-12: qty 20

## 2018-02-12 MED ORDER — FENTANYL CITRATE (PF) 250 MCG/5ML IJ SOLN
INTRAMUSCULAR | Status: AC
  Filled 2018-02-12: qty 5

## 2018-02-12 MED ORDER — METHOCARBAMOL 500 MG PO TABS
500.0000 mg | ORAL_TABLET | Freq: Four times a day (QID) | ORAL | Status: DC | PRN
  Administered 2018-02-12 – 2018-02-13 (×2): 500 mg via ORAL
  Filled 2018-02-12 (×2): qty 1

## 2018-02-12 MED ORDER — PROPOFOL 10 MG/ML IV BOLUS
INTRAVENOUS | Status: DC | PRN
  Administered 2018-02-12: 200 mg via INTRAVENOUS

## 2018-02-12 MED ORDER — MEPERIDINE HCL 50 MG/ML IJ SOLN: 6.2500 mg | INTRAMUSCULAR | Status: DC | PRN

## 2018-02-12 MED ORDER — CEFAZOLIN SODIUM-DEXTROSE 2-3 GM-%(50ML) IV SOLR
INTRAVENOUS | Status: DC | PRN
  Administered 2018-02-12: 2 g via INTRAVENOUS

## 2018-02-12 MED ORDER — OXYCODONE HCL 5 MG PO TABS
5.0000 mg | ORAL_TABLET | ORAL | Status: DC | PRN
  Administered 2018-02-12 – 2018-02-13 (×4): 10 mg via ORAL
  Filled 2018-02-12 (×4): qty 2

## 2018-02-12 MED ORDER — METOCLOPRAMIDE HCL 5 MG PO TABS
5.0000 mg | ORAL_TABLET | Freq: Three times a day (TID) | ORAL | Status: DC | PRN
  Administered 2018-02-14 (×2): 10 mg via ORAL
  Filled 2018-02-12 (×3): qty 2

## 2018-02-12 MED ORDER — LIDOCAINE HCL (CARDIAC) 20 MG/ML IV SOLN
INTRAVENOUS | Status: AC
  Filled 2018-02-12: qty 5

## 2018-02-12 MED ORDER — METHOCARBAMOL 1000 MG/10ML IJ SOLN
500.0000 mg | Freq: Four times a day (QID) | INTRAVENOUS | Status: DC | PRN
  Filled 2018-02-12: qty 5

## 2018-02-12 MED ORDER — HYDROMORPHONE HCL 1 MG/ML IJ SOLN
INTRAMUSCULAR | Status: AC
  Filled 2018-02-12: qty 0.5

## 2018-02-12 MED ORDER — ALBUMIN HUMAN 5 % IV SOLN
INTRAVENOUS | Status: DC | PRN
  Administered 2018-02-12 (×2): via INTRAVENOUS

## 2018-02-12 SURGICAL SUPPLY — 76 items
BANDAGE ACE 4X5 VEL STRL LF (GAUZE/BANDAGES/DRESSINGS) ×4 IMPLANT
BANDAGE ACE 6X5 VEL STRL LF (GAUZE/BANDAGES/DRESSINGS) IMPLANT
BANDAGE ESMARK 6X9 LF (GAUZE/BANDAGES/DRESSINGS) IMPLANT
BENZOIN TINCTURE PRP APPL 2/3 (GAUZE/BANDAGES/DRESSINGS) ×4 IMPLANT
BIT DRILL 4.3MMS DISTAL GRDTED (BIT) ×2 IMPLANT
BNDG COHESIVE 4X5 TAN STRL (GAUZE/BANDAGES/DRESSINGS) ×8 IMPLANT
BNDG ESMARK 6X9 LF (GAUZE/BANDAGES/DRESSINGS)
BNDG GAUZE ELAST 4 BULKY (GAUZE/BANDAGES/DRESSINGS) ×4 IMPLANT
CANISTER SUCT 3000ML PPV (MISCELLANEOUS) ×4 IMPLANT
COVER MAYO STAND STRL (DRAPES) ×4 IMPLANT
COVER PERINEAL POST (MISCELLANEOUS) ×4 IMPLANT
COVER SURGICAL LIGHT HANDLE (MISCELLANEOUS) ×8 IMPLANT
DRAPE C-ARM 42X72 X-RAY (DRAPES) ×4 IMPLANT
DRAPE HALF SHEET 40X57 (DRAPES) IMPLANT
DRAPE INCISE IOBAN 66X45 STRL (DRAPES) ×8 IMPLANT
DRAPE ORTHO SPLIT 77X108 STRL (DRAPES) ×2
DRAPE STERI IOBAN 125X83 (DRAPES) ×4 IMPLANT
DRAPE SURG ORHT 6 SPLT 77X108 (DRAPES) ×2 IMPLANT
DRILL 4.3MMS DISTAL GRADUATED (BIT) ×4
DRSG AQUACEL AG ADV 3.5X14 (GAUZE/BANDAGES/DRESSINGS) ×4 IMPLANT
DRSG EMULSION OIL 3X3 NADH (GAUZE/BANDAGES/DRESSINGS) ×4 IMPLANT
DRSG MEPILEX BORDER 4X12 (GAUZE/BANDAGES/DRESSINGS) ×4 IMPLANT
DRSG MEPILEX BORDER 4X4 (GAUZE/BANDAGES/DRESSINGS) ×8 IMPLANT
DRSG PAD ABDOMINAL 8X10 ST (GAUZE/BANDAGES/DRESSINGS) ×4 IMPLANT
DRSG VAC ATS SM SENSATRAC (GAUZE/BANDAGES/DRESSINGS) ×4 IMPLANT
DURAPREP 26ML APPLICATOR (WOUND CARE) ×4 IMPLANT
ELECT REM PT RETURN 9FT ADLT (ELECTROSURGICAL) ×4
ELECTRODE REM PT RTRN 9FT ADLT (ELECTROSURGICAL) ×2 IMPLANT
EVACUATOR 1/8 PVC DRAIN (DRAIN) IMPLANT
GAUZE SPONGE 4X4 12PLY STRL (GAUZE/BANDAGES/DRESSINGS) ×8 IMPLANT
GAUZE XEROFORM 1X8 LF (GAUZE/BANDAGES/DRESSINGS) ×4 IMPLANT
GAUZE XEROFORM 5X9 LF (GAUZE/BANDAGES/DRESSINGS) ×4 IMPLANT
GLOVE BIOGEL PI IND STRL 8 (GLOVE) ×4 IMPLANT
GLOVE BIOGEL PI INDICATOR 8 (GLOVE) ×4
GLOVE ORTHO TXT STRL SZ7.5 (GLOVE) ×8 IMPLANT
GOWN STRL REUS W/ TWL LRG LVL3 (GOWN DISPOSABLE) ×2 IMPLANT
GOWN STRL REUS W/ TWL XL LVL3 (GOWN DISPOSABLE) ×2 IMPLANT
GOWN STRL REUS W/TWL 2XL LVL3 (GOWN DISPOSABLE) ×4 IMPLANT
GOWN STRL REUS W/TWL LRG LVL3 (GOWN DISPOSABLE) ×6 IMPLANT
GOWN STRL REUS W/TWL XL LVL3 (GOWN DISPOSABLE) ×2
GUIDEPIN 3.2X17.5 THRD DISP (PIN) ×12 IMPLANT
GUIDEWIRE BALL NOSE 100CM (WIRE) ×4 IMPLANT
GUIDEWIRE BALL NOSE 80CM (WIRE) ×4 IMPLANT
HIP FRAC NAIL LAG SCR 10.5X100 (Orthopedic Implant) ×2 IMPLANT
KIT BASIN OR (CUSTOM PROCEDURE TRAY) ×4 IMPLANT
KIT ROOM TURNOVER OR (KITS) ×4 IMPLANT
LINER BOOT UNIVERSAL DISP (MISCELLANEOUS) ×4 IMPLANT
MANIFOLD NEPTUNE II (INSTRUMENTS) ×4 IMPLANT
NAIL HIP FRA AFFIX 130X9X400 L (Nail) ×4 IMPLANT
NS IRRIG 1000ML POUR BTL (IV SOLUTION) ×8 IMPLANT
PACK GENERAL/GYN (CUSTOM PROCEDURE TRAY) ×4 IMPLANT
PAD ARMBOARD 7.5X6 YLW CONV (MISCELLANEOUS) ×8 IMPLANT
PAD CAST 4YDX4 CTTN HI CHSV (CAST SUPPLIES) ×2 IMPLANT
PADDING CAST COTTON 4X4 STRL (CAST SUPPLIES) ×2
SCREW ANTI-ROTATION 75MM (Screw) ×4 IMPLANT
SCREW BONE CORTICAL 5.0X42 (Screw) ×4 IMPLANT
SCREW BONE CORTICAL 5.0X44 (Screw) ×4 IMPLANT
SCREW CANN THRD AFF 10.5X100 (Orthopedic Implant) ×2 IMPLANT
SCREW DRILL BIT ANIT ROTATION (BIT) ×4 IMPLANT
SCREWDRIVER HEX TIP 3.5MM (MISCELLANEOUS) ×4 IMPLANT
SPONGE LAP 18X18 X RAY DECT (DISPOSABLE) ×16 IMPLANT
STAPLER VISISTAT 35W (STAPLE) ×8 IMPLANT
STOCKINETTE IMPERVIOUS 9X36 MD (GAUZE/BANDAGES/DRESSINGS) IMPLANT
SUT ETHILON 1 TP 1 60 (SUTURE) ×8 IMPLANT
SUT ETHILON 2 0 PSLX (SUTURE) ×20 IMPLANT
SUT ETHILON 4 0 FS 1 (SUTURE) IMPLANT
SUT VIC AB 0 CT1 27 (SUTURE) ×4
SUT VIC AB 0 CT1 27XBRD ANBCTR (SUTURE) ×4 IMPLANT
SUT VIC AB 1 CT1 27 (SUTURE) ×2
SUT VIC AB 1 CT1 27XBRD ANBCTR (SUTURE) ×2 IMPLANT
SUT VIC AB 1 CTX 27 (SUTURE) ×8 IMPLANT
SUT VIC AB 2-0 CT1 27 (SUTURE) ×8
SUT VIC AB 2-0 CT1 TAPERPNT 27 (SUTURE) ×8 IMPLANT
TOWEL OR 17X24 6PK STRL BLUE (TOWEL DISPOSABLE) ×4 IMPLANT
TOWEL OR 17X26 10 PK STRL BLUE (TOWEL DISPOSABLE) ×4 IMPLANT
WATER STERILE IRR 1000ML POUR (IV SOLUTION) IMPLANT

## 2018-02-12 NOTE — Transfer of Care (Signed)
Immediate Anesthesia Transfer of Care Note  Patient: Isaiah Mendoza  Procedure(s) Performed: REMOVAL LEFT TIBIAL TRACTION PIN FASCIOTOMY CLOSURE VS VAC CHANGE (Left Leg Lower) LEFT TROCHANTERIC FEMORAL NAIL WITH INTERLOCK  (Left Hip)  Patient Location: PACU  Anesthesia Type:General  Level of Consciousness: awake, alert  and oriented  Airway & Oxygen Therapy: Patient Spontanous Breathing and Patient connected to nasal cannula oxygen  Post-op Assessment: Report given to RN and Post -op Vital signs reviewed and stable  Post vital signs: Reviewed and stable  Last Vitals:  Vitals:   02/12/18 1100 02/12/18 1620  BP: (!) 144/83 (!) 155/85  Pulse: 100 (!) 108  Resp: 10 12  Temp:  36.7 C  SpO2: 100% 100%    Last Pain:  Vitals:   02/12/18 1620  TempSrc:   PainSc: 10-Worst pain ever         Complications: No apparent anesthesia complications
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
## 2018-02-12 NOTE — Anesthesia Postprocedure Evaluation (Signed)
Anesthesia Post Note  Patient: Isaiah Mendoza  Procedure(s) Performed: REMOVAL LEFT TIBIAL TRACTION PIN FASCIOTOMY CLOSURE VS VAC CHANGE (Left Leg Lower) LEFT TROCHANTERIC FEMORAL NAIL WITH INTERLOCK  (Left Hip)     Patient location during evaluation: PACU Anesthesia Type: General Level of consciousness: awake and alert Pain management: pain level controlled Vital Signs Assessment: post-procedure vital signs reviewed and stable Respiratory status: spontaneous breathing, nonlabored ventilation, respiratory function stable and patient connected to nasal cannula oxygen Cardiovascular status: blood pressure returned to baseline and stable Postop Assessment: no apparent nausea or vomiting Anesthetic complications: no    Last Vitals:  Vitals:   02/12/18 1620 02/12/18 1635  BP: (!) 155/85 (!) 141/85  Pulse: (!) 108 (!) 112  Resp: 12 12  Temp: 36.7 C   SpO2: 100% 100%    Last Pain:  Vitals:   02/12/18 1644  TempSrc:   PainSc: Asleep                 Perfecto Purdy A.

## 2018-02-12 NOTE — Progress Notes (Signed)
Trauma Service Note  Subjective: Patient is awake and alert.  For surgery this afternoon.  Objective: Vital signs in last 24 hours: Temp:  [98.4 F (36.9 C)-99.8 F (37.7 C)] 98.6 F (37 C) (03/20 0400) Pulse Rate:  [90-220] 102 (03/20 0700) Resp:  [9-34] 10 (03/20 0700) BP: (107-160)/(54-126) 107/73 (03/20 0700) SpO2:  [97 %-100 %] 98 % (03/20 0700) Arterial Line BP: (157-203)/(62-82) 174/82 (03/19 1500) Last BM Date: (pta)  Intake/Output from previous day: 03/19 0701 - 03/20 0700 In: 2195.8 [I.V.:1265.8; Blood:930] Out: 3635 [Urine:3290; Drains:345] Intake/Output this shift: No intake/output data recorded.  General: No acute distress  Lungs: Clear  Abd: Benign  Extremities: Great pulses and good sensatiion  Neuro: Intact  Lab Results: CBC  Recent Labs    02/11/18 0809 02/11/18 1520 02/12/18 0415  WBC 12.9*  --  10.6*  HGB 6.4* 8.5* 8.5*  HCT 19.8* 25.5* 25.4*  PLT 98*  --  112*   BMET Recent Labs    02/11/18 0503 02/12/18 0415  NA 134* 139  K 3.4* 3.4*  CL 101 104  CO2 25 27  GLUCOSE 123* 111*  BUN <5* <5*  CREATININE 0.76 0.72  CALCIUM 8.2* 8.4*   PT/INR No results for input(s): LABPROT, INR in the last 72 hours. ABG No results for input(s): PHART, HCO3 in the last 72 hours.  Invalid input(s): PCO2, PO2  Studies/Results: No results found.  Anti-infectives: Anti-infectives (From admission, onward)   None      Assessment/Plan: s/p Procedure(s): LEFT SUPERFICIAL FEMORAL ARTERY BYPASS USING SAPHENOUS VEIN GRAFT; LIGATION OF COMMON FEMORAL VEIN, THROMBECTOMY, ANGIOGRAPHY, AND FASCIOTOMIES SCROTUM EXPLORATION INSERTION OF TRACTION PIN For surgery today, should also have fasciotomies closed.  LOS: 4 days   Marta LamasJames O. Gae BonWyatt, III, MD, FACS [phone] Trauma Surgeon 02/12/2018
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
## 2018-02-12 NOTE — Anesthesia Procedure Notes (Signed)
Procedure Name: Intubation Date/Time: 02/12/2018 12:55 PM Performed by: Zarea Diesing T, CRNA Pre-anesthesia Checklist: Patient identified, Emergency Drugs available, Suction available and Patient being monitored Patient Re-evaluated:Patient Re-evaluated prior to induction Oxygen Delivery Method: Circle system utilized Preoxygenation: Pre-oxygenation with 100% oxygen Induction Type: IV induction Ventilation: Mask ventilation without difficulty Laryngoscope Size: Miller and 3 Grade View: Grade I Tube type: Oral Tube size: 7.5 mm Number of attempts: 1 Airway Equipment and Method: Patient positioned with wedge pillow and Stylet Placement Confirmation: ETT inserted through vocal cords under direct vision,  breath sounds checked- equal and bilateral and positive ETCO2 Secured at: 22 cm Tube secured with: Tape Dental Injury: Teeth and Oropharynx as per pre-operative assessment
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
## 2018-02-12 NOTE — Progress Notes (Signed)
    Subjective  - POD #3  Feels ok    Physical Exam:  Palpable left DP pulse Motor and sensory intact Compartments soft with vac in place Incisions ok       Assessment/Plan:  POD #3  To OR today for possible fasciotomy closure Add ASA prior to d/c  Isaiah Mendoza 02/12/2018 9:10 AM --  Vitals:   02/12/18 0830 02/12/18 0842  BP: (!) 141/72 (!) 141/72  Pulse: 78 81  Resp: 10 (!) 9  Temp: 98.6 F (37 C)   SpO2: 100% 97%    Intake/Output Summary (Last 24 hours) at 02/12/2018 0910 Last data filed at 02/12/2018 0700 Gross per 24 hour  Intake 2040 ml  Output 3135 ml  Net -1095 ml     Laboratory CBC    Component Value Date/Time   WBC 10.6 (H) 02/12/2018 0415   HGB 8.5 (L) 02/12/2018 0415   HCT 25.4 (L) 02/12/2018 0415   PLT 112 (L) 02/12/2018 0415    BMET    Component Value Date/Time   NA 139 02/12/2018 0415   K 3.4 (L) 02/12/2018 0415   CL 104 02/12/2018 0415   CO2 27 02/12/2018 0415   GLUCOSE 111 (H) 02/12/2018 0415   BUN <5 (L) 02/12/2018 0415   CREATININE 0.72 02/12/2018 0415   CALCIUM 8.4 (L) 02/12/2018 0415   GFRNONAA >60 02/12/2018 0415   GFRAA >60 02/12/2018 0415    COAG Lab Results  Component Value Date   INR 1.21 02/08/2018   INR 1.29 02/08/2018   INR 1.32 02/08/2018   No results found for: PTT  Antibiotics Anti-infectives (From admission, onward)   None       V. Charlena CrossWells Pruitt Taboada IV, M.D. [facility] Office: [phone] Pager:  [phone]
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
## 2018-02-13 ENCOUNTER — Other Ambulatory Visit: Payer: Self-pay

## 2018-02-13 ENCOUNTER — Encounter (HOSPITAL_COMMUNITY): Payer: Self-pay | Admitting: *Deleted

## 2018-02-13 LAB — BASIC METABOLIC PANEL
ANION GAP: 9 (ref 5–15)
BUN: <5 mg/dL — ABNORMAL LOW (ref 6–20)
CO2: 25 mmol/L (ref 22–32)
Calcium: 7.9 mg/dL — ABNORMAL LOW (ref 8.9–10.3)
Chloride: 99 mmol/L — ABNORMAL LOW (ref 101–111)
Creatinine, Ser: 0.77 mg/dL (ref 0.61–1.24)
GFR calc Af Amer: >60 mL/min (ref >60)
GFR calc non Af Amer: >60 mL/min (ref >60)
GLUCOSE: 130 mg/dL — AB (ref 65–99)
POTASSIUM: 3.6 mmol/L (ref 3.5–5.1)
Sodium: 133 mmol/L — ABNORMAL LOW (ref 135–145)

## 2018-02-13 LAB — CBC
HEMATOCRIT: 24.5 % — AB (ref 39.0–52.0)
Hemoglobin: 8.4 g/dL — ABNORMAL LOW (ref 13.0–17.0)
MCH: 29.3 pg (ref 26.0–34.0)
MCHC: 34.3 g/dL (ref 30.0–36.0)
MCV: 85.4 fL (ref 78.0–100.0)
Platelets: 140 10*3/uL — ABNORMAL LOW (ref 150–400)
RBC: 2.87 MIL/uL — ABNORMAL LOW (ref 4.22–5.81)
RDW: 14.9 % (ref 11.5–15.5)
WBC: 11.9 10*3/uL — AB (ref 4.0–10.5)

## 2018-02-13 MED ORDER — POLYETHYLENE GLYCOL 3350 17 G PO PACK
17.0000 g | PACK | Freq: Every day | ORAL | Status: DC
  Administered 2018-02-13 – 2018-02-17 (×3): 17 g via ORAL
  Filled 2018-02-13 (×4): qty 1

## 2018-02-13 MED ORDER — ACETAMINOPHEN 500 MG PO TABS
1000.0000 mg | ORAL_TABLET | Freq: Three times a day (TID) | ORAL | Status: DC
  Administered 2018-02-13 – 2018-02-17 (×13): 1000 mg via ORAL
  Filled 2018-02-13 (×13): qty 2

## 2018-02-13 MED ORDER — HYDROMORPHONE HCL 1 MG/ML IJ SOLN
0.5000 mg | INTRAMUSCULAR | Status: DC | PRN
  Administered 2018-02-13 (×2): 1 mg via INTRAVENOUS
  Filled 2018-02-13 (×2): qty 1

## 2018-02-13 MED ORDER — METHOCARBAMOL 1000 MG/10ML IJ SOLN
500.0000 mg | Freq: Three times a day (TID) | INTRAVENOUS | Status: DC
  Filled 2018-02-13: qty 5

## 2018-02-13 MED ORDER — OXYCODONE HCL 5 MG PO TABS
5.0000 mg | ORAL_TABLET | ORAL | Status: DC | PRN
  Administered 2018-02-13 – 2018-02-15 (×6): 15 mg via ORAL
  Administered 2018-02-15: 10 mg via ORAL
  Administered 2018-02-15 (×2): 15 mg via ORAL
  Administered 2018-02-16: 5 mg via ORAL
  Administered 2018-02-16 – 2018-02-17 (×5): 15 mg via ORAL
  Administered 2018-02-17: 10 mg via ORAL
  Administered 2018-02-17: 15 mg via ORAL
  Filled 2018-02-13 (×2): qty 3
  Filled 2018-02-13: qty 1
  Filled 2018-02-13: qty 3
  Filled 2018-02-13: qty 2
  Filled 2018-02-13 (×2): qty 3
  Filled 2018-02-13: qty 1
  Filled 2018-02-13 (×2): qty 3
  Filled 2018-02-13: qty 2
  Filled 2018-02-13 (×6): qty 3
  Filled 2018-02-13: qty 2
  Filled 2018-02-13: qty 3

## 2018-02-13 MED ORDER — HYDROMORPHONE HCL 1 MG/ML IJ SOLN
0.5000 mg | INTRAMUSCULAR | Status: DC | PRN
  Administered 2018-02-14: 1 mg via INTRAVENOUS
  Filled 2018-02-13: qty 1

## 2018-02-13 MED ORDER — METHOCARBAMOL 500 MG PO TABS
500.0000 mg | ORAL_TABLET | Freq: Three times a day (TID) | ORAL | Status: DC
  Administered 2018-02-13 – 2018-02-17 (×13): 500 mg via ORAL
  Filled 2018-02-13 (×13): qty 1
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
## 2018-02-13 NOTE — Progress Notes (Signed)
  Progress Note    02/13/2018 9:31 AM 1 Day Post-Op  Subjective:  Complains of LLE edema this morning   Vitals:   02/13/18 0816 02/13/18 0928  BP:    Pulse: (!) 111 (!) 145  Resp: 15   Temp:    SpO2: 100%    Physical Exam: Lungs:  Non labored Incisions:  R groin incision stable; L groin incision with stable in place, serous collection on dressing; dressing left in place on fasciotomy incisions Extremities:  Palpable L DP with sensation intact L foot Abdomen:  Soft Neurologic: A&O  CBC    Component Value Date/Time   WBC 11.9 (H) 02/13/2018 0602   RBC 2.87 (L) 02/13/2018 0602   HGB 8.4 (L) 02/13/2018 0602   HCT 24.5 (L) 02/13/2018 0602   PLT 140 (L) 02/13/2018 0602   MCV 85.4 02/13/2018 0602   MCH 29.3 02/13/2018 0602   MCHC 34.3 02/13/2018 0602   RDW 14.9 02/13/2018 0602   LYMPHSABS 1.5 02/12/2018 0415   MONOABS 1.1 (H) 02/12/2018 0415   EOSABS 0.1 02/12/2018 0415   BASOSABS 0.0 02/12/2018 0415    BMET    Component Value Date/Time   NA 133 (L) 02/13/2018 0602   K 3.6 02/13/2018 0602   CL 99 (L) 02/13/2018 0602   CO2 25 02/13/2018 0602   GLUCOSE 130 (H) 02/13/2018 0602   BUN <5 (L) 02/13/2018 0602   CREATININE 0.77 02/13/2018 0602   CALCIUM 7.9 (L) 02/13/2018 0602   GFRNONAA >60 02/13/2018 0602   GFRAA >60 02/13/2018 0602    INR    Component Value Date/Time   INR 1.21 02/08/2018 2319     Intake/Output Summary (Last 24 hours) at 02/13/2018 0931 Last data filed at 02/13/2018 0442 Gross per 24 hour  Intake 4387.84 ml  Output 2250 ml  Net 2137.84 ml     Assessment/Plan:  27 y.o. male is s/p #1: Repair of left superficial femoral artery injury with a contralateral right greater saphenous vein interposition graft #2: Ligation of left femoral vein #3: 4 compartment fasciotomies #4: Thrombectomy of left superficial femoral and popliteal artery as well as the tibial arteries #5: Intraoperative arteriogram #6: Exposure of left below-knee popliteal  artery #7:Exposure of left anterior tibial artery with thrombectomy #8: Application of wound VAC to the medial and lateral fasciotomy incisions #9: Vein patch angioplasty to the distal common femoral artery and proximal superficial femoral artery following endarterectomy   L DP pulse palpable; sensation intact L foot Lateral fasciotomy incision closed, medial fasciotomy incision partially closed with wound vac in place ASA prior to d/c We will continue to follow with you   Emilie RutterMatthew Reham Slabaugh, PA-C [facility] [phone] 02/13/2018 9:31 AM
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
## 2018-02-13 NOTE — Progress Notes (Signed)
Received patient to room 4NP08 at 02:05 during down time. Patient orientated to the unit and call system. Patient is hooked up to heart monitor and family is at the bedside. Patient c/o pain rated 9out of 10. Dilaudid 1 mg IVP was over rode, witnessed by [employer] nurse Deanna ArtisKeisha RN and administered during down time. Patient is repositioned and drink is given. Will continue to monitor.
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
## 2018-02-14 LAB — BASIC METABOLIC PANEL
Anion gap: 10 (ref 5–15)
BUN: 5 mg/dL — ABNORMAL LOW (ref 6–20)
CHLORIDE: 100 mmol/L — AB (ref 101–111)
CO2: 26 mmol/L (ref 22–32)
CREATININE: 0.79 mg/dL (ref 0.61–1.24)
Calcium: 8.3 mg/dL — ABNORMAL LOW (ref 8.9–10.3)
Glucose, Bld: 131 mg/dL — ABNORMAL HIGH (ref 65–99)
POTASSIUM: 3 mmol/L — AB (ref 3.5–5.1)
SODIUM: 136 mmol/L (ref 135–145)

## 2018-02-14 LAB — CBC
HCT: 24.8 % — ABNORMAL LOW (ref 39.0–52.0)
HEMOGLOBIN: 8.2 g/dL — AB (ref 13.0–17.0)
MCH: 28.4 pg (ref 26.0–34.0)
MCHC: 33.1 g/dL (ref 30.0–36.0)
MCV: 85.8 fL (ref 78.0–100.0)
PLATELETS: 165 10*3/uL (ref 150–400)
RBC: 2.89 MIL/uL — AB (ref 4.22–5.81)
RDW: 14.9 % (ref 11.5–15.5)
WBC: 14.9 10*3/uL — ABNORMAL HIGH (ref 4.0–10.5)

## 2018-02-14 MED ORDER — TRAMADOL HCL 50 MG PO TABS: 50.0000 mg | ORAL_TABLET | Freq: Four times a day (QID) | ORAL | 0 refills | Status: DC | PRN

## 2018-02-14 MED ORDER — FERROUS GLUCONATE 324 (38 FE) MG PO TABS: 324.0000 mg | ORAL_TABLET | Freq: Two times a day (BID) | ORAL | 1 refills | Status: DC

## 2018-02-14 MED ORDER — OXYCODONE HCL 10 MG PO TABS: 10.0000 mg | ORAL_TABLET | ORAL | 0 refills | Status: DC | PRN

## 2018-02-14 MED ORDER — POTASSIUM CHLORIDE CRYS ER 20 MEQ PO TBCR
20.0000 meq | EXTENDED_RELEASE_TABLET | Freq: Three times a day (TID) | ORAL | Status: DC
  Administered 2018-02-14 – 2018-02-15 (×4): 20 meq via ORAL
  Filled 2018-02-14 (×4): qty 1

## 2018-02-14 MED ORDER — FERROUS GLUCONATE 324 (38 FE) MG PO TABS
324.0000 mg | ORAL_TABLET | Freq: Two times a day (BID) | ORAL | Status: DC
  Administered 2018-02-14 – 2018-02-17 (×7): 324 mg via ORAL
  Filled 2018-02-14 (×9): qty 1

## 2018-02-14 MED ORDER — PROSIGHT PO TABS
1.0000 | ORAL_TABLET | Freq: Every day | ORAL | Status: DC
  Administered 2018-02-14 – 2018-02-17 (×4): 1 via ORAL
  Filled 2018-02-14 (×4): qty 1

## 2018-02-14 MED ORDER — ASPIRIN 325 MG PO TBEC: 325.0000 mg | DELAYED_RELEASE_TABLET | Freq: Every day | ORAL | 1 refills | Status: DC

## 2018-02-14 MED ORDER — PROSIGHT PO TABS: 1.0000 | ORAL_TABLET | Freq: Every day | ORAL | 1 refills | Status: DC

## 2018-02-14 MED ORDER — ACETAMINOPHEN 500 MG PO TABS: 1000.0000 mg | ORAL_TABLET | Freq: Three times a day (TID) | ORAL | 0 refills | Status: DC | PRN

## 2018-02-14 MED ORDER — POLYETHYLENE GLYCOL 3350 17 G PO PACK: 17.0000 g | PACK | Freq: Every day | ORAL | 1 refills | Status: AC

## 2018-02-14 MED ORDER — METHOCARBAMOL 500 MG PO TABS: 500.0000 mg | ORAL_TABLET | Freq: Three times a day (TID) | ORAL | 0 refills | Status: DC | PRN

## 2018-02-14 MED ORDER — OXYCODONE HCL 10 MG PO TABS: 10.0000 mg | ORAL_TABLET | Freq: Four times a day (QID) | ORAL | 0 refills | Status: AC | PRN

## 2018-02-14 MED ORDER — ASPIRIN EC 81 MG PO TBEC: 81.0000 mg | DELAYED_RELEASE_TABLET | Freq: Every day | ORAL | 1 refills | Status: DC

## 2018-02-14 MED ORDER — POLYETHYLENE GLYCOL 3350 17 G PO PACK: 17.0000 g | PACK | Freq: Every day | ORAL | 0 refills | Status: DC

## 2018-02-14 MED ORDER — DOCUSATE SODIUM 100 MG PO CAPS: 100.0000 mg | ORAL_CAPSULE | Freq: Two times a day (BID) | ORAL | 1 refills | Status: DC

## 2018-02-14 MED ORDER — TRAMADOL HCL 50 MG PO TABS
100.0000 mg | ORAL_TABLET | Freq: Four times a day (QID) | ORAL | Status: DC
  Administered 2018-02-14 – 2018-02-17 (×14): 100 mg via ORAL
  Filled 2018-02-14 (×15): qty 2

## 2018-02-14 MED ORDER — ACETAMINOPHEN 500 MG PO TABS
1000.0000 mg | ORAL_TABLET | Freq: Three times a day (TID) | ORAL | 0 refills | Status: DC | PRN
Start: 2018-02-14 — End: 2018-02-17

## 2018-02-14 MED ORDER — DOCUSATE SODIUM 100 MG PO CAPS: 100.0000 mg | ORAL_CAPSULE | Freq: Two times a day (BID) | ORAL | 0 refills | Status: DC

## 2018-02-14 NOTE — Discharge Instructions (Addendum)
Nonweightbearing to left leg Wound vac changes Monday, Wednesday, Friday Keep other incisions covered with clean/dry dressing For pain control: recommend taking Tylenol around the clock. Ok to take robaxin 3 times daily. Alternate oxycodone and tramadol, slowly wean off as tolerated. Take daily aspirin 81mg  until recommended by vascular surgeon to stop   Negative Pressure Wound Therapy Dressing Care Negative pressure wound therapy (NPWT) is a device that helps wounds heal. NPWT helps the wound stay clean and healthy while it heals from the inside. NPWT uses a bandage (dressing) that is made of a sponge or gauze-like material. The dressing is placed in or inside the wound. The wound is then covered and sealed with a cover dressing that sticks to your skin (adhesive). This keeps air out. A tube connects the cover dressing to a small pump. The pump sucks fluid and germs from the wound. The pump also controls any odor coming from the wound. What are the risks? NPWT is usually safe to use. The most common problem is skin irritation from the dressing adhesive, but there are many ways to prevent this from happening. However, more serious problems can develop, such as:  Bleeding.  Infection.  Dehydration.  Pain.  How to change your dressing How often you change your dressing depends on your wound. If the pump is off for more than two hours, the dressing will need to be changed. Follow your health care providers instructions on how often to change it. Your health care provider may change your dressing, or a family member, friend, or caregiver may be shown how to change the dressing. It is important to:  Wear gloves and protective clothing while changing a dressing. This may include eye protection.  Never let anyone change your dressing if he or she has an infection, skin condition, or skin wound or cut of any size.  Preparing to change your dressing  If needed, take pain medicine 30 minutes  before the dressing change as prescribed by your health care provider.  Set up a clean station for wound care. You will need: ? A disposable garbage bag that is open and ready to use. ? Hand sanitizer. ? Wound cleanser or saltwater solution (saline) as told by your health care provider. ? New dressing material or bandages. Make sure to open the dressing package so that the dressing remains on the inside of the package. You may also need the following in your clean station:  A box of vinyl gloves.  Tape.  Skin protectant. This may be a wipe, film, or spray.  Clean or germ-free (sterile) scissors.  Wound liner.  Cotton tip applicators.  Removing your old dressing  Wash your hands with soap and water. Dry your hands with a clean towel. If soap and water are not available, use hand sanitizer.  Put on gloves.  Turn off the pump and disconnect the tubing from the dressing.  Carefully remove the adhesive cover dressing in the direction of your hair growth. Only touch the outside edges of the dressing.  Remove the dressing that is inside the wound. If the dressing sticks, use a wound cleanser or saline solution to wet the dressing. This helps it come off more easily.  Throw the old dressing supplies into the ready garbage bag.  Remove your gloves by grabbing the cuff and turning the glove inside out. Place the gloves in the trash immediately.  Wash your hands with soap and water. Dry your hands with a clean towel. If soap and  water are not available, use hand sanitizer. Cleaning your wound  Follow your health care provider's instructions on how to clean your wound. This may include using a saline or recommended wound cleanser.  Do not use over-the-counter medicated or antiseptic creams, sprays, liquids, or dressings unless told to do so by your health care provider.  Clean the area thoroughly with the recommended saline solution or wound cleanser and a clean gauze pad.  Throw  the gauze pad into the garbage bag.  Wash your hands with soap and water. Dry your hands with a clean towel. If soap and water are not available, use hand sanitizer. Applying the dressing  Apply a skin protectant to any skin that will be exposed to adhesive. Let the skin protectant dry.  Put a new dressing into the wound.  Apply a new cover dressing and tube.  Take off your gloves. Put them in the plastic bag with the old dressing. Tie the bag shut and throw it away.  Wash your hands with soap and water. Dry your hands with a clean towel. If soap and water are not available, use hand sanitizer.  Attach the suction and turn the pump back on. Do not change the settings on the machine without talking to a health care provider.  Replace the container in the pump that collects fluid if it is full. Do this at least once a week. Contact a health care provider if:  You have new pain.  You develop irritation, a rash, or itching around the wound or dressing.  You see new black or yellow tissue in your wound.  The dressing changes are painful or cause bleeding.  The pump has been off for more than two hours and you do not know how to change the dressing.  The alarm for the pump goes off and you do not know what to do. Get help right away if:  You have a lot of bleeding.  You see a sudden change in the color or texture of the drainage.  The wound breaks open.  You have severe pain.  You have signs of infection, such as: ? More redness, swelling, or pain. ? More fluid or blood. ? Warmth. ? Pus or a bad smell. ? Red streaks leading from wound. ? A fever. This information is not intended to replace advice given to you by your health care provider. Make sure you discuss any questions you have with your health care provider. Document Released: 02/04/2012 Document Revised: 12/08/2015 Document Reviewed: 08/18/2015 Elsevier Interactive Patient Education  2018 Elsevier Inc.    Pain  Medicine Instructions How can pain medicine affect me? You were prescribed pain medicine. This medicine may:  Make you tired or sleepy.  Make you feel dizzy.  Affect how well you can: ? Drive ? Do certain activities.  Pain medicine may not make all of your pain go away. You should be comfortable enough to:  Move.  Breathe.  Take care of yourself.  How often should I take pain medicine and how much should I take?  Take pain medicine only as told by your doctor and only as needed for pain.  You do not need to take pain medicine if you are not having pain, unless your doctor tells you to do that.  You can take less than the prescribed dose if you find that less medicine helps your pain.  If you have very bad (severe) pain, call your doctor. Do not take more pills than told  by your doctor. Do not take pills more often than told by your doctor. What should I avoid while I am taking pain medicine? Follow these instructions after you start taking pain medicine, while you are taking the medicine, and for 8 hours after you stop taking the medicine:  Do not drive.  Do not use machinery.  Do not use power tools.  Do not sign legal documents.  Do not drink alcohol.  Do not take sleeping pills.  Do not take care of children by yourself.  Do not do any activities that involve climbing or being in high places.  Do not go into any body of water unless there is an adult nearby who can watch and help you. This includes: ? Lakes. ? Rivers. ? Oceans. ? Spas. ? Swimming pools.  How can I keep others safe while I am taking pain medicine?  Store your pain medicine as told by your doctor. Make sure that you keep it where children and pets cannot reach it.  Do not share your pain medicine with anyone.  Do not save any leftover pills. If you have any leftover pain medicine, get rid of it or destroy it as told by your doctor. What else do I need to know about taking pain  medicine?  Use a poop (stool) softener if you have trouble pooping (constipation) because of your pain medicine. Eating more fruits and vegetables also helps with constipation.  Write down the times when you take your pain medicine. Look at the times before you take your next dose of medicine.  Your pain medicine might have acetaminophen in it. Do not take any other acetaminophen while you are taking this medicine. An overdose of acetaminophen can do very bad damage to your liver. If you are taking any medicines in addition to your pain medicine, check the active ingredients on those medicines to see if acetaminophen is listed. When should I call my doctor?  Your medicine is not helping the pain.  You do either of these soon after you take the medicine: ? Throw up (vomit). ? Have watery poop (diarrhea).  You have new pain in areas that did not hurt before.  You have an allergic reaction to your medicine. This may include: ? Feeling itchy. ? Swelling. ? Feeling dizzy. ? Getting a new rash.  You cannot put up with feeling: ? Dizzy. ? Sick to your stomach (nauseous). When should I call 911 or go to the emergency room?  You pass out (faint).  You feel very confused.  You throw up again and again.  Your skin or lips turn pale or bluish in color.  You are: ? Short of breath. ? Breathing much more slowly than usual.  You have a very bad allergic reaction to your medicine. This includes: ? Developing a swollen tongue. ? Having trouble breathing. This information is not intended to replace advice given to you by your health care provider. Make sure you discuss any questions you have with your health care provider. Document Released: 04/30/2008 Document Revised: 07/19/2016 Document Reviewed: 09/16/2014 Elsevier Interactive Patient Education  2018 [employer].
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
## 2018-02-14 NOTE — Plan of Care (Signed)
  Problem: Clinical Measurements: Goal: Will remain free from infection Outcome: Progressing Goal: Cardiovascular complication will be avoided Outcome: Progressing   Problem: Coping: Goal: Level of anxiety will decrease Outcome: Progressing   Problem: Activity: Goal: Risk for activity intolerance will decrease Outcome: Not Progressing   

## 2018-02-14 NOTE — Progress Notes (Signed)
Spoke with Dr. Lindie SpruceWyatt.  Will plan for discharge with wound vac for fasciotomy incision.  Would like to have him on a asa 81mg  at discharge.  Durene CalWells Brabham

## 2018-02-14 NOTE — Consult Note (Signed)
WOC Nurse wound consult note Reason for Consult: postoperative NPWT dressing change to fasciotomy site  Wound type: surgical  Pressure Injury POA: NA Measurement: open area medial calf 12cm x 4cm x 1.0cm  Closed suture line lateral calf  Wound UXL:KGMWNbed:clean, pink, viable tissue Drainage (amount, consistency, odor) moderate, serosanguinous  Periwound: sutures along distal and proximal incision with middle of fasciotomy open on the medial calf.  Dressing procedure/placement/frequency: Ioban and 1pc of black foam removed from the medial calf wound, xeroform removed from lateral calf incision and distal and proximal areas of the medial calf wound.  Single layer of xeroform used to cover lateral calf closed incision and the two closed areas of the medial incision 1pc of black foam used to fill the wound bed on the medial wound.   Plans for DC soon with NPWT dressing.  Bedside nurse to attach patient to home unit once delivered to patient room. Explained this to the patient and family.   NPWT dressing changes to the medial calf wound to continue M[facility]W/F [facility], if patient still inpatient on Monday WOC nurse will follow up at that time.   Talan Gildner [facility] MSN,RN,CWOCN, CNS, [employer] [phone]

## 2018-02-14 NOTE — Progress Notes (Signed)
Trauma Service Note  Subjective: Patient sleep on arrival.  Complaining o f 6-7/10 pain.  Objective: Vital signs in last 24 hours: Temp:  [98.1 F (36.7 C)-98.9 F (37.2 C)] 98.6 F (37 C) (03/22 0403) Pulse Rate:  [97-145] 113 (03/22 0400) Resp:  [8-18] 16 (03/22 0400) BP: (129-143)/(64-95) 129/69 (03/22 0400) SpO2:  [97 %-100 %] 100 % (03/22 0400) Last BM Date: 02/12/18(Patient states he has a BM daily since admit.)  Intake/Output from previous day: 03/21 0701 - 03/22 0700 In: 240 [P.O.:240] Out: 975 [Urine:825; Drains:150] Intake/Output this shift: No intake/output data recorded.  General: Asleep.  Wants to go home today.  Lungs: Clear  Abd: Soft, bening  Extremities: VAC on left leg.  Great pulses.  Will take a look at open wound site to see if he will need NPWD at home.  Neuro: Intact  Lab Results: CBC  Recent Labs    02/13/18 0602 02/14/18 0400  WBC 11.9* 14.9*  HGB 8.4* 8.2*  HCT 24.5* 24.8*  PLT 140* 165   BMET Recent Labs    02/13/18 0602 02/14/18 0400  NA 133* 136  K 3.6 3.0*  CL 99* 100*  CO2 25 26  GLUCOSE 130* 131*  BUN <5* 5*  CREATININE 0.77 0.79  CALCIUM 7.9* 8.3*   PT/INR No results for input(s): LABPROT, INR in the last 72 hours. ABG No results for input(s): PHART, HCO3 in the last 72 hours.  Invalid input(s): PCO2, PO2  Studies/Results: Dg C-arm 1-60 Min  Result Date: 02/12/2018 CLINICAL DATA:  Femoral nail placement EXAM: LEFT FEMUR 2 VIEWS; DG C-ARM 61-120 MIN COMPARISON:  02/08/2018 FLUOROSCOPY TIME:  Fluoroscopy Time:  2 minutes 24 seconds Radiation Exposure Index (if provided by the fluoroscopic device): Not available Number of Acquired Spot Images: 4 FINDINGS: Medullary rod is noted within the femur. Two fixation screws are noted traversing the femoral neck. Distal fixation screws in the femur are seen. Fracture fragments are in near anatomic alignment. IMPRESSION: ORIF of proximal left femoral fracture. Electronically  Signed   By: Alcide Clever M.D.   On: 02/12/2018 16:10   Dg C-arm 1-60 Min  Result Date: 02/12/2018 CLINICAL DATA:  Femoral nail placement EXAM: LEFT FEMUR 2 VIEWS; DG C-ARM 61-120 MIN COMPARISON:  02/08/2018 FLUOROSCOPY TIME:  Fluoroscopy Time:  2 minutes 24 seconds Radiation Exposure Index (if provided by the fluoroscopic device): Not available Number of Acquired Spot Images: 4 FINDINGS: Medullary rod is noted within the femur. Two fixation screws are noted traversing the femoral neck. Distal fixation screws in the femur are seen. Fracture fragments are in near anatomic alignment. IMPRESSION: ORIF of proximal left femoral fracture. Electronically Signed   By: Alcide Clever M.D.   On: 02/12/2018 16:10   Dg Femur Min 2 Views Left  Result Date: 02/12/2018 CLINICAL DATA:  Femoral nail placement EXAM: LEFT FEMUR 2 VIEWS; DG C-ARM 61-120 MIN COMPARISON:  02/08/2018 FLUOROSCOPY TIME:  Fluoroscopy Time:  2 minutes 24 seconds Radiation Exposure Index (if provided by the fluoroscopic device): Not available Number of Acquired Spot Images: 4 FINDINGS: Medullary rod is noted within the femur. Two fixation screws are noted traversing the femoral neck. Distal fixation screws in the femur are seen. Fracture fragments are in near anatomic alignment. IMPRESSION: ORIF of proximal left femoral fracture. Electronically Signed   By: Alcide Clever M.D.   On: 02/12/2018 16:10    Anti-infectives: Anti-infectives (From admission, onward)   Start     Dose/Rate Route Frequency Ordered Stop  02/12/18 2100  ceFAZolin (ANCEF) IVPB 1 g/50 mL premix     1 g 100 mL/hr over 30 Minutes Intravenous Every 8 hours 02/12/18 1711 02/13/18 1418   02/12/18 1235  ceFAZolin (ANCEF) 2-4 GM/100ML-% IVPB    Note to Pharmacy:  Sandi RavelingSchonewitz, Leigh   : cabinet override      02/12/18 1235 02/13/18 0044      Assessment/Plan: s/p Procedure(s): REMOVAL LEFT TIBIAL TRACTION PIN FASCIOTOMY CLOSURE VS VAC CHANGE LEFT TROCHANTERIC FEMORAL NAIL WITH  INTERLOCK  Discharge See if VAC needed at home.  LOS: 6 days   Marta LamasJames O. Gae BonWyatt, III, MD, FACS [phone] Trauma Surgeon 02/14/2018

## 2018-02-14 NOTE — Progress Notes (Signed)
Orthopedic Tech Progress Note Patient Details:  Isaiah Mendoza [birth year] [id number]  Patient ID: Isaiah Mendoza, male   DOB: [birth year], 27 y.o.   MRN: [id number] Applied frame to bed  [name], Oveta Idris J 02/14/2018, 9:51 AM
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
## 2018-02-15 LAB — TYPE AND SCREEN
ABO/RH(D): A NEG
ANTIBODY SCREEN: NEGATIVE
UNIT DIVISION: 0
Unit division: 0
Unit division: 0

## 2018-02-15 LAB — CBC
HCT: 25 % — ABNORMAL LOW (ref 39.0–52.0)
HEMOGLOBIN: 8.1 g/dL — AB (ref 13.0–17.0)
MCH: 28.2 pg (ref 26.0–34.0)
MCHC: 32.4 g/dL (ref 30.0–36.0)
MCV: 87.1 fL (ref 78.0–100.0)
Platelets: 227 10*3/uL (ref 150–400)
RBC: 2.87 MIL/uL — ABNORMAL LOW (ref 4.22–5.81)
RDW: 15.1 % (ref 11.5–15.5)
WBC: 13.4 10*3/uL — AB (ref 4.0–10.5)

## 2018-02-15 LAB — BASIC METABOLIC PANEL
Anion gap: 8 (ref 5–15)
BUN: 6 mg/dL (ref 6–20)
CALCIUM: 8.3 mg/dL — AB (ref 8.9–10.3)
CO2: 28 mmol/L (ref 22–32)
Chloride: 100 mmol/L — ABNORMAL LOW (ref 101–111)
Creatinine, Ser: 0.84 mg/dL (ref 0.61–1.24)
GFR calc non Af Amer: >60 mL/min (ref >60)
Glucose, Bld: 132 mg/dL — ABNORMAL HIGH (ref 65–99)
Potassium: 3 mmol/L — ABNORMAL LOW (ref 3.5–5.1)
Sodium: 136 mmol/L (ref 135–145)

## 2018-02-15 LAB — BPAM RBC
BLOOD PRODUCT EXPIRATION DATE: 201903262359
BLOOD PRODUCT EXPIRATION DATE: 201903282359
Blood Product Expiration Date: 201903282359
ISSUE DATE / TIME: 201903200823
ISSUE DATE / TIME: 201903201120
UNIT TYPE AND RH: 600
Unit Type and Rh: 600
Unit Type and Rh: 600

## 2018-02-15 LAB — MAGNESIUM: Magnesium: 2 mg/dL (ref 1.7–2.4)

## 2018-02-15 MED ORDER — POTASSIUM CHLORIDE CRYS ER 20 MEQ PO TBCR
40.0000 meq | EXTENDED_RELEASE_TABLET | Freq: Two times a day (BID) | ORAL | Status: DC
  Administered 2018-02-15 – 2018-02-17 (×4): 40 meq via ORAL
  Filled 2018-02-15 (×4): qty 2
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
## 2018-02-15 NOTE — Progress Notes (Signed)
[facility]  Subjective  - feels better   Objective 115/72 (!) 115 98.2 F (36.8 C) (Axillary) 19 97%  Intake/Output Summary (Last 24 hours) at 02/15/2018 1051 Last data filed at 02/15/2018 1000 Gross per 24 hour  Intake 300 ml  Output 730 ml  Net -430 ml   2+ DP pulse incisions healing  Assessment/Planning: Awaiting home VAC for d/c  Dr Myra GianottiBrabham will see Monday if still in hospital  [facility] Specialists[facility] Tramane Mendoza 02/15/2018 10:51 AM --  Laboratory Lab Results: Recent Labs    02/14/18 0400 02/15/18 0413  WBC 14.9* 13.4*  HGB 8.2* 8.1*  HCT 24.8* 25.0*  PLT 165 227   BMET Recent Labs    02/14/18 0400 02/15/18 0858  NA 136 136  K 3.0* 3.0*  CL 100* 100*  CO2 26 28  GLUCOSE 131* 132*  BUN 5* 6  CREATININE 0.79 0.84  CALCIUM 8.3* 8.3*    COAG Lab Results  Component Value Date   INR 1.21 02/08/2018   INR 1.29 02/08/2018   INR 1.32 02/08/2018   No results found for: PTT

## 2018-02-15 NOTE — Progress Notes (Signed)
Patient is set to discharge on today 02/15/2018. Waiting on the delivery of his wound vac. Called Jerilee Hohommy Rodgers (rep) three times at the number left in CSW note: [phone] and rings to a fax number. Charge Nurse called one of those times.  Also called and left a message with weekend CSW : [phone], awaiting on a return call and will then update patient and family.
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
## 2018-02-17 LAB — CBC
HCT: 25.5 % — ABNORMAL LOW (ref 39.0–52.0)
Hemoglobin: 8 g/dL — ABNORMAL LOW (ref 13.0–17.0)
MCH: 27.4 pg (ref 26.0–34.0)
MCHC: 31.4 g/dL (ref 30.0–36.0)
MCV: 87.3 fL (ref 78.0–100.0)
PLATELETS: 377 10*3/uL (ref 150–400)
RBC: 2.92 MIL/uL — AB (ref 4.22–5.81)
RDW: 15 % (ref 11.5–15.5)
WBC: 11.8 10*3/uL — ABNORMAL HIGH (ref 4.0–10.5)

## 2018-02-17 LAB — BASIC METABOLIC PANEL
Anion gap: 8 (ref 5–15)
BUN: 8 mg/dL (ref 6–20)
CO2: 27 mmol/L (ref 22–32)
CREATININE: 0.81 mg/dL (ref 0.61–1.24)
Calcium: 8.8 mg/dL — ABNORMAL LOW (ref 8.9–10.3)
Chloride: 102 mmol/L (ref 101–111)
Glucose, Bld: 110 mg/dL — ABNORMAL HIGH (ref 65–99)
Potassium: 4.3 mmol/L (ref 3.5–5.1)
SODIUM: 137 mmol/L (ref 135–145)

## 2018-02-17 MED ORDER — ACETAMINOPHEN 500 MG PO TABS: 1000.0000 mg | ORAL_TABLET | Freq: Three times a day (TID) | ORAL | 0 refills | Status: DC | PRN

## 2018-02-17 MED ORDER — ASPIRIN 325 MG PO TBEC: 325.0000 mg | DELAYED_RELEASE_TABLET | Freq: Every day | ORAL | 0 refills | Status: DC

## 2018-02-17 NOTE — Consult Note (Signed)
WOC Nurse wound follow up Wound type: surgical  Measurement: 12cm x 3cm x 0.5cm  Wound bed: clean, viable muscle tissue, bleeds at wound edges  Drainage (amount, consistency, odor) minimal in VAC canister, serosanguinous  Periwound: edema but improved Dressing procedure/placement/frequency: Removed old NPWT dressing Suture lines protected/covered with single layer of xeroform gauze laterally and medially  Filled wound with ___1___ pieces of black foam. Sealed NPWT dressing at 100mm HG/15600mmHG, per MD orders Patient received PO pain medication per bedside nurse prior to dressing change Patient tolerated procedure well Reviewed with bedside nurse to hook to home unit when arrives for DC to home.   WOC nurse will continue to provide NPWT dressing changed due to the complexity of the dressing change.   Isaiah Mendoza [facility] MSN,RN,CWOCN, CNS, [employer] [phone]
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
## 2018-02-17 NOTE — Progress Notes (Addendum)
PT Cancellation Note  Patient Details Name: Isaiah Mendoza MRN: [id number] DOB: [birth year]   Cancelled Treatment:    Reason Eval/Treat Not Completed: Patient declined, no reason specified(pt refused mobilizing stating he is "chilling" after getting vac changed. Pt reports no pain but unwilling to mobilize at this time. Will reattempt as time allows) 1107  Attempted 2nd time at 1241 and pt again refused stating he had just gone to the bathroom and back to bed. Will attempt at later date.   Isaiah Mendoza 02/17/2018, 11:07 AM  Isaiah Mendoza, PT [phone]

## 2018-02-19 ENCOUNTER — Telehealth (HOSPITAL_COMMUNITY): Payer: Self-pay

## 2018-02-19 NOTE — Telephone Encounter (Signed)
Please return call [facility] at [phone] She is asking for verbal orders to visit pt. 3 times a week for approx. 4 weeks to treat wound on leg.

## 2018-02-21 NOTE — Telephone Encounter (Signed)
Returned call to [facility] with [facility] and provided verbal order for HHPT 3x per week for approximately 4 weeks.   Wells GuilesKelly Rayburn , [facility] 02/21/2018, 3:42 PM Pager: [phone] Mon-Fri 7:00 am-4:30 pm Sat-Sun 7:00 am-11:30 am

## 2018-02-24 ENCOUNTER — Telehealth (HOSPITAL_COMMUNITY): Payer: Self-pay

## 2018-02-24 ENCOUNTER — Telehealth (INDEPENDENT_AMBULATORY_CARE_PROVIDER_SITE_OTHER): Payer: Self-pay | Admitting: Orthopaedic Surgery

## 2018-02-24 DIAGNOSIS — W3400XA Accidental discharge from unspecified firearms or gun, initial encounter: Secondary | ICD-10-CM

## 2018-02-24 MED ORDER — METHOCARBAMOL 500 MG PO TABS: 500.0000 mg | ORAL_TABLET | Freq: Four times a day (QID) | ORAL | 0 refills | Status: DC | PRN

## 2018-02-24 MED ORDER — OXYCODONE-ACETAMINOPHEN 10-325 MG PO TABS: 1.0000 | ORAL_TABLET | Freq: Four times a day (QID) | ORAL | 0 refills | Status: DC | PRN

## 2018-02-24 NOTE — Telephone Encounter (Signed)
Patient's mother called requesting an RX refill on the patient's tramadol, oxycodone and the methocarbamol.  CB#[phone].  Thank you.

## 2018-02-24 NOTE — Telephone Encounter (Signed)
Can you please advise since Dr. Yates is out of the office? Thanks. 

## 2018-02-24 NOTE — Telephone Encounter (Signed)
I tried to return call to patient's mom to advise. Call cannot go through. Message asks to try again later.

## 2018-02-24 NOTE — Telephone Encounter (Signed)
Attempted to reach patient. States to call again later. Will have to wait for return call to advise.

## 2018-02-24 NOTE — Telephone Encounter (Signed)
Can give patient Percocet 10/325 1 tab p.o. every 6 hours as needed for pain number 50 tablets no refills, Robaxin 500 mg 1 tab p.o. every 6 hours as needed for spasms number 50 tablets no refills.  Must discontinue Tylenol and tramadol.

## 2018-02-24 NOTE — Telephone Encounter (Signed)
Thayer OhmChris (PhysicalTherapist) called from [facility] [phone]. Requesting PT orders.  Please return call.

## 2018-02-25 ENCOUNTER — Telehealth (HOSPITAL_COMMUNITY): Payer: Self-pay

## 2018-02-25 ENCOUNTER — Telehealth: Payer: Self-pay | Admitting: *Deleted

## 2018-02-25 NOTE — Telephone Encounter (Signed)
Isaiah Mendoza and gave verbal orders for PT.  Isaiah Mendoza, [facility]-C [facility] Pager [phone]

## 2018-02-25 NOTE — Telephone Encounter (Signed)
Spoke with [name] who asked if she could place a silicone material she called versitile under the black foam of the patient's wound vac. Since vascular surgery placed the wound vac and they will determine when it should be removed I recommended that [name] reach out to Dr. Myra GianottiBrabham with vascular surgery to see if he is comfortable with the material under the black foam. She was agreeable to this plan.  Mattie MarlinJessica Sherrod Toothman, [facility] Pager [phone]
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
## 2018-02-25 NOTE — Telephone Encounter (Signed)
Attempted to call. Message states call did not go through and try again later. Will wait for return call from patient or his mother.  Must go over instructions of discontinuing tylenol and tramadol before script can be given to patient.

## 2018-02-25 NOTE — Telephone Encounter (Signed)
Patient's mother called back to inquire about the prescription refill.  She stated that the tylenol and tramadol have been completed and the patient has been out of pain medication for 2 days.  She requested a call once the percocet is ready to be picked up.  Her number is [phone].  Thank you.

## 2018-02-25 NOTE — Telephone Encounter (Signed)
appt added 4/15  Do I need to contact the pt or HH?

## 2018-02-25 NOTE — Telephone Encounter (Signed)
Alexia Freestoneatty w/ [facility]  [phone] Please return call - requesting orders regarding wound bed.

## 2018-02-26 NOTE — Telephone Encounter (Signed)
I called patient's mom and advised. Script at front desk for pick up.  Instructed on tylenol and tramadol.

## 2018-03-03 ENCOUNTER — Other Ambulatory Visit: Payer: Self-pay

## 2018-03-10 ENCOUNTER — Encounter: Payer: Self-pay | Admitting: Surgery

## 2018-03-10 ENCOUNTER — Encounter (HOSPITAL_COMMUNITY): Payer: Self-pay

## 2018-03-11 ENCOUNTER — Telehealth (INDEPENDENT_AMBULATORY_CARE_PROVIDER_SITE_OTHER): Payer: Self-pay | Admitting: Orthopaedic Surgery

## 2018-03-11 NOTE — Telephone Encounter (Signed)
Duplicate

## 2018-03-11 NOTE — Telephone Encounter (Signed)
Patient is on percocet, and the past 3 days his stomach has been hurting, he has been able to use the bathroom. He states its hurting more than his leg. He just needs to know something else he can take. He wanted to give you this call back number as well as be transferred to triage. # [phone]

## 2018-03-11 NOTE — Telephone Encounter (Signed)
I called and advised Isaiah Mendoza of Isaiah Mendoza response.  He states that he doesn't even know what Isaiah Mendoza did for him I tried to explain it to him. He does state that Isaiah Mendoza gave him the last rx, I adivsed that it was Fayrene FearingJames, Dr. Ophelia CharterYates PA that prescribed it for him.  Patient doesn't know why he needs to see Isaiah Mendoza, as advised that Isaiah Mendoza did do surgery on him but his is still requesting the pain meds so he needs to be seen.  I advised that we can not refill pain meds on someone that we are not seeing for care.  An appt was made for 03/12/18 @ 915 as a workin with Isaiah Mendoza.
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
## 2018-03-11 NOTE — Telephone Encounter (Signed)
Patient returned call asked for a call back. The number to contact patient is [phone]

## 2018-03-11 NOTE — Telephone Encounter (Signed)
I called and spoke with patient and his mother. She states that patient has been going to the bathroom but is having stomach pain. Patient has been out of pain meds and needs a refill. He also states that staple is coming out and bleeding a little.  Patient had appt with [facility] yesterday but was given the incorrect appt info. They rescheduled him to 5/20 but patient explained about staple coming out and appt was made for 4/23.  Patient does not have appt scheduled with Dr. Ophelia CharterYates.   Please advise on refill of pain medication. Also, would you like for patient to have follow up appt here in office?

## 2018-03-12 ENCOUNTER — Ambulatory Visit (INDEPENDENT_AMBULATORY_CARE_PROVIDER_SITE_OTHER): Payer: Self-pay

## 2018-03-12 ENCOUNTER — Ambulatory Visit (INDEPENDENT_AMBULATORY_CARE_PROVIDER_SITE_OTHER): Payer: Self-pay | Admitting: Orthopaedic Surgery

## 2018-03-12 DIAGNOSIS — S72142A Displaced intertrochanteric fracture of left femur, initial encounter for closed fracture: Secondary | ICD-10-CM

## 2018-03-12 DIAGNOSIS — W3400XA Accidental discharge from unspecified firearms or gun, initial encounter: Secondary | ICD-10-CM
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
## 2018-03-12 NOTE — Telephone Encounter (Signed)
noted 

## 2018-03-18 ENCOUNTER — Ambulatory Visit (INDEPENDENT_AMBULATORY_CARE_PROVIDER_SITE_OTHER): Payer: Self-pay | Admitting: Family

## 2018-03-18 ENCOUNTER — Other Ambulatory Visit: Payer: Self-pay

## 2018-03-18 ENCOUNTER — Encounter: Payer: Self-pay | Admitting: Family

## 2018-03-18 VITALS — BP 105/69 | HR 86 | Temp 97.8°F | Resp 16 | Ht 71.0 in | Wt 180.0 lb

## 2018-03-18 DIAGNOSIS — Z4802 Encounter for removal of sutures: Secondary | ICD-10-CM

## 2018-03-18 DIAGNOSIS — Z9889 Other specified postprocedural states: Secondary | ICD-10-CM

## 2018-03-18 NOTE — Patient Instructions (Addendum)
To decrease swelling in your foot and leg: Elevate feet above slightly bent knees, feet above heart, overnight and 3-4 times per day for 20 minutes.    What You Need to Know About Marijuana Use Marijuana is a mixture of the dried leaves and flowers of the hemp plant Cannabis sativa. The plant's active ingredients (cannabinoids) change the chemistry of the brain. If you smoke or eat marijuana, you will experience changes in the way you think, feel, and behave. Many people use marijuana because it helps them relax and puts them in a pleasurable mood (marijuana high). Some people use marijuana for medical effects, such as:  Reduced nausea.  Increased appetite.  Reduced muscle spasm.  Pain relief.  Researchers are studying other possible medical uses for marijuana. How can marijuana use affect me? Many people find a marijuana high to be pleasurable and relaxing. Other people find a marijuana high to be uncomfortable or anxiety-causing. This drug can cause short-term and long-term physical and mental effects. Taking high doses of marijuana or trying to quit marijuana can also affect you. Short-term effects of marijuana use include:  Temporary relief of symptoms from a medical condition.  Changes in mood and perception (feeling high).  Increased hunger.  Increased heart rate.  Slowed movement and reaction time.  Poor memory, judgment, and problem solving ability.  Altered sense of time.  Changes to vision.  Bloodshot eyes.  Coughing.  Long-term effects of marijuana use include:  Higher risk of lung and breathing problems.  Higher risk of heart attack.  Higher risk of testicular cancer.  Mental and physical dependence (addiction).  Slowed brain development in young people. Babies whose mothers used marijuana during pregnancy have an increased risk of problems with brain development and behavior.  Temporary periods of false perceptions or beliefs (hallucinations or  paranoia).  Worsening of mental illness.  Onset of new mental illness such as anxiety, depression, or suicidal thoughts.  Withdrawal symptoms when stopping marijuana, such as sleeplessness, anxiety, cravings, and anger.  Difficulty maintaining healthy relationships.  Poor memory, and difficulty concentrating and learning. This can result in decreased intelligence and poor performance at school or work, and an increased risk of dropping out of school.  Higher risk of using other substances like alcohol and nicotine.  High doses of marijuana can cause:  Panic.  Anxiety.  Mental confusion.  Hallucinations.  Quitting marijuana after using it for a long time can cause withdrawal symptoms, such as:  Headache.  Shakiness.  Sweating.  Stomach pain.  Nausea.  Restlessness.  Irritability.  Trouble sleeping.  Decreased appetite.  What are the benefits of not using marijuana? Not using marijuana can keep you from becoming dependent on it. You can avoid the negative effects of the drug that can reduce your quality of life. You can avoid accidents caused by the slowed reaction time that is common with marijuana use. If I already use marijuana, what steps can I take to stop using it? If you are not physically or mentally dependent on marijuana, you should be able to stop using it on your own. If you cannot stop on your own, ask your health care provider for help. Treatment for marijuana addiction is similar to treatment for other addictions. It may include:  Cognitive-behavioral therapy (psychotherapy). This may include individual or group therapy.  Joining a support group.  Treating medical, behavioral, or mental health conditions that exist along with marijuana dependency.  Where can I get more information? Learn more about:  Marijuana  from the [place] [employer] on Drug Abuse: Natworking.hu  Medical marijuana from the  [employer] of Health: RunningShows.co.za  Treatment options from the Substance Abuse and Mental Health Services Administration: https://findtreatment.http://gonzalez-rivas.net/  Recovery from marijuana dependency from Recovery.org: http://www.recovery.org/topics/marijuana-recovery  When should I seek medical care? Talk with your health care provider if:  You want to stop using marijuana but you cannot.  You have withdrawal symptoms when you try to stop using marijuana.  You are using marijuana every day.  You are using marijuana along with other drugs like cocaine or alcohol.  You have anxiety or depression.  You have hallucinations or paranoia.  Marijuana use is interfering with your relationships or your ability to function normally at school or at work.  Summary  You may become physically or mentally dependent on marijuana.  Long-term use may interfere with your ability to function normally at home, school, or work.  Marijuana addiction is treatable. This information is not intended to replace advice given to you by your health care provider. Make sure you discuss any questions you have with your health care provider. Document Released: 11/04/2015 Document Revised: 08/01/2016 Document Reviewed: 08/01/2016 Elsevier Interactive Patient Education  2018 [employer].
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
## 2018-04-14 ENCOUNTER — Encounter: Payer: Self-pay | Admitting: Surgery

## 2018-04-14 ENCOUNTER — Encounter (HOSPITAL_COMMUNITY): Payer: Self-pay

## 2018-04-15 ENCOUNTER — Ambulatory Visit (INDEPENDENT_AMBULATORY_CARE_PROVIDER_SITE_OTHER): Payer: Self-pay | Admitting: Orthopaedic Surgery

## 2018-04-22 ENCOUNTER — Ambulatory Visit (INDEPENDENT_AMBULATORY_CARE_PROVIDER_SITE_OTHER): Payer: Self-pay | Admitting: Orthopaedic Surgery

## 2018-04-28 ENCOUNTER — Ambulatory Visit (INDEPENDENT_AMBULATORY_CARE_PROVIDER_SITE_OTHER): Payer: Self-pay | Admitting: Surgery

## 2018-04-28 ENCOUNTER — Ambulatory Visit (HOSPITAL_COMMUNITY)
Admission: RE | Admit: 2018-04-28 | Discharge: 2018-04-28 | Disposition: A | Discharge status: Home / Self Care | Payer: Self-pay | Source: Ambulatory Visit | Attending: Surgery | Admitting: Surgery

## 2018-04-28 ENCOUNTER — Encounter: Payer: Self-pay | Admitting: Surgery

## 2018-04-28 DIAGNOSIS — R9439 Abnormal result of other cardiovascular function study: Secondary | ICD-10-CM | POA: Insufficient documentation

## 2018-04-28 DIAGNOSIS — Z9889 Other specified postprocedural states: Secondary | ICD-10-CM

## 2018-04-28 DIAGNOSIS — I998 Other disorder of circulatory system: Secondary | ICD-10-CM | POA: Insufficient documentation
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
## 2018-05-06 ENCOUNTER — Ambulatory Visit (INDEPENDENT_AMBULATORY_CARE_PROVIDER_SITE_OTHER): Payer: Self-pay | Admitting: Orthopaedic Surgery

## 2018-05-28 ENCOUNTER — Encounter (INDEPENDENT_AMBULATORY_CARE_PROVIDER_SITE_OTHER): Payer: Self-pay | Admitting: Orthopaedic Surgery

## 2018-06-11 ENCOUNTER — Ambulatory Visit (INDEPENDENT_AMBULATORY_CARE_PROVIDER_SITE_OTHER): Payer: Self-pay | Admitting: Orthopaedic Surgery

## 2018-07-21 ENCOUNTER — Other Ambulatory Visit: Payer: Self-pay

## 2018-07-21 DIAGNOSIS — Z9889 Other specified postprocedural states: Secondary | ICD-10-CM
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
## 2018-08-11 ENCOUNTER — Ambulatory Visit: Payer: Self-pay | Admitting: Surgery

## 2018-08-11 ENCOUNTER — Ambulatory Visit (HOSPITAL_COMMUNITY): Payer: Self-pay | Attending: Surgery

## 2018-08-11 ENCOUNTER — Encounter: Payer: Self-pay | Admitting: Surgery

## 2019-02-11 ENCOUNTER — Ambulatory Visit (INDEPENDENT_AMBULATORY_CARE_PROVIDER_SITE_OTHER): Payer: Self-pay | Admitting: Orthopaedic Surgery

## 2019-04-27 DEATH — deceased
